# Patient Record
Sex: Female | Born: 1965 | Race: Black or African American | Hispanic: No | Marital: Single | Smoking: Never smoker
Health system: Southern US, Community
[De-identification: ages and names within clinical notes are randomized; demographics above are authoritative.]

## PROBLEM LIST (undated history)

## (undated) DIAGNOSIS — G510 Bell's palsy: Secondary | ICD-10-CM

## (undated) DIAGNOSIS — I10 Essential (primary) hypertension: Secondary | ICD-10-CM

## (undated) DIAGNOSIS — H409 Unspecified glaucoma: Secondary | ICD-10-CM

## (undated) DIAGNOSIS — A64 Unspecified sexually transmitted disease: Secondary | ICD-10-CM

## (undated) HISTORY — DX: Essential (primary) hypertension: I10

## (undated) HISTORY — DX: Unspecified sexually transmitted disease: A64

## (undated) HISTORY — PX: DILATION AND CURETTAGE OF UTERUS: SHX78

## (undated) HISTORY — DX: Bell's palsy: G51.0

## (undated) HISTORY — DX: Unspecified glaucoma: H40.9

---

## 1991-11-20 DIAGNOSIS — A64 Unspecified sexually transmitted disease: Secondary | ICD-10-CM

## 1991-11-20 HISTORY — DX: Unspecified sexually transmitted disease: A64

## 1998-10-10 ENCOUNTER — Other Ambulatory Visit: Admission: RE | Admit: 1998-10-10 | Discharge: 1998-10-10 | Payer: Self-pay | Admitting: Obstetrics and Gynecology

## 1999-10-27 ENCOUNTER — Other Ambulatory Visit: Admission: RE | Admit: 1999-10-27 | Discharge: 1999-10-27 | Payer: Self-pay | Admitting: Obstetrics and Gynecology

## 2000-08-19 ENCOUNTER — Emergency Department (HOSPITAL_COMMUNITY): Admission: EM | Admit: 2000-08-19 | Discharge: 2000-08-19 | Payer: Self-pay | Admitting: Emergency Medicine

## 2000-10-28 ENCOUNTER — Other Ambulatory Visit: Admission: RE | Admit: 2000-10-28 | Discharge: 2000-10-28 | Payer: Self-pay | Admitting: Obstetrics and Gynecology

## 2001-02-08 ENCOUNTER — Encounter (INDEPENDENT_AMBULATORY_CARE_PROVIDER_SITE_OTHER): Payer: Self-pay | Admitting: *Deleted

## 2001-02-08 ENCOUNTER — Ambulatory Visit (HOSPITAL_COMMUNITY): Admission: RE | Admit: 2001-02-08 | Discharge: 2001-02-08 | Payer: Self-pay | Admitting: Obstetrics and Gynecology

## 2001-11-03 ENCOUNTER — Other Ambulatory Visit: Admission: RE | Admit: 2001-11-03 | Discharge: 2001-11-03 | Payer: Self-pay | Admitting: Obstetrics and Gynecology

## 2001-11-19 DIAGNOSIS — G510 Bell's palsy: Secondary | ICD-10-CM

## 2001-11-19 HISTORY — DX: Bell's palsy: G51.0

## 2002-09-14 ENCOUNTER — Encounter: Payer: Self-pay | Admitting: Emergency Medicine

## 2002-09-14 ENCOUNTER — Encounter: Admission: RE | Admit: 2002-09-14 | Discharge: 2002-09-14 | Payer: Self-pay | Admitting: Emergency Medicine

## 2002-12-04 ENCOUNTER — Other Ambulatory Visit: Admission: RE | Admit: 2002-12-04 | Discharge: 2002-12-04 | Payer: Self-pay | Admitting: Obstetrics and Gynecology

## 2004-01-05 ENCOUNTER — Other Ambulatory Visit: Admission: RE | Admit: 2004-01-05 | Discharge: 2004-01-05 | Payer: Self-pay | Admitting: Obstetrics and Gynecology

## 2004-05-09 ENCOUNTER — Other Ambulatory Visit: Admission: RE | Admit: 2004-05-09 | Discharge: 2004-05-09 | Payer: Self-pay | Admitting: Obstetrics and Gynecology

## 2005-01-08 ENCOUNTER — Encounter: Admission: RE | Admit: 2005-01-08 | Discharge: 2005-01-08 | Payer: Self-pay | Admitting: Emergency Medicine

## 2005-12-19 ENCOUNTER — Other Ambulatory Visit: Admission: RE | Admit: 2005-12-19 | Discharge: 2005-12-19 | Payer: Self-pay | Admitting: Obstetrics and Gynecology

## 2006-01-14 ENCOUNTER — Ambulatory Visit (HOSPITAL_COMMUNITY): Admission: RE | Admit: 2006-01-14 | Discharge: 2006-01-14 | Payer: Self-pay | Admitting: Obstetrics and Gynecology

## 2006-01-14 ENCOUNTER — Encounter (INDEPENDENT_AMBULATORY_CARE_PROVIDER_SITE_OTHER): Payer: Self-pay | Admitting: Specialist

## 2006-01-14 HISTORY — PX: OTHER SURGICAL HISTORY: SHX169

## 2006-11-05 ENCOUNTER — Other Ambulatory Visit: Admission: RE | Admit: 2006-11-05 | Discharge: 2006-11-05 | Payer: Self-pay | Admitting: Obstetrics & Gynecology

## 2006-12-18 ENCOUNTER — Encounter: Admission: RE | Admit: 2006-12-18 | Discharge: 2006-12-18 | Payer: Self-pay | Admitting: Emergency Medicine

## 2007-01-13 ENCOUNTER — Other Ambulatory Visit: Admission: RE | Admit: 2007-01-13 | Discharge: 2007-01-13 | Payer: Self-pay | Admitting: Obstetrics and Gynecology

## 2007-12-22 ENCOUNTER — Encounter: Admission: RE | Admit: 2007-12-22 | Discharge: 2007-12-22 | Payer: Self-pay | Admitting: Obstetrics and Gynecology

## 2008-03-17 ENCOUNTER — Other Ambulatory Visit: Admission: RE | Admit: 2008-03-17 | Discharge: 2008-03-17 | Payer: Self-pay | Admitting: Obstetrics and Gynecology

## 2008-12-27 ENCOUNTER — Encounter: Admission: RE | Admit: 2008-12-27 | Discharge: 2008-12-27 | Payer: Self-pay | Admitting: Obstetrics and Gynecology

## 2009-03-18 ENCOUNTER — Other Ambulatory Visit: Admission: RE | Admit: 2009-03-18 | Discharge: 2009-03-18 | Payer: Self-pay | Admitting: Obstetrics and Gynecology

## 2010-01-02 ENCOUNTER — Encounter: Admission: RE | Admit: 2010-01-02 | Discharge: 2010-01-02 | Payer: Self-pay | Admitting: Obstetrics and Gynecology

## 2010-12-09 ENCOUNTER — Other Ambulatory Visit: Payer: Self-pay | Admitting: Obstetrics and Gynecology

## 2010-12-09 DIAGNOSIS — Z1239 Encounter for other screening for malignant neoplasm of breast: Secondary | ICD-10-CM

## 2011-01-08 ENCOUNTER — Ambulatory Visit: Payer: Self-pay

## 2011-01-22 ENCOUNTER — Ambulatory Visit
Admission: RE | Admit: 2011-01-22 | Discharge: 2011-01-22 | Disposition: A | Discharge status: Home / Self Care | Payer: Self-pay | Source: Ambulatory Visit | Attending: Obstetrics and Gynecology | Admitting: Obstetrics and Gynecology

## 2011-01-22 DIAGNOSIS — Z1239 Encounter for other screening for malignant neoplasm of breast: Secondary | ICD-10-CM

## 2011-04-06 NOTE — Op Note (Signed)
[facility]  Patient:    Carla Kaiser, Carla Kaiser                      MRN: [id number] Proc. Date: 02/08/01 Adm. Date:  [id number] Disc. Date: [id number] Attending:  Wandalee Ferdinand                           Operative Report  PREOPERATIVE DIAGNOSES:       1. Missed abortion at approximately eight weeks                                  gestation.                               2. Cervical polyp.  POSTOPERATIVE DIAGNOSES:      1. Missed abortion, pathology pending.                               2. Cervical polyp versus products of conception                                  presenting at cervical os - pathology                                  pending.  OPERATION:                    1. Suction dilatation and curettage.                               2. Polypectomy.  SURGEON:                      Rudy Jew. Ashley Royalty, M.D.  ANESTHESIA:                   Monitored anesthesia care and 1% Xylocaine                               paracervical block (10 cc).  ESTIMATED BLOOD LOSS:         50 cc.  COMPLICATIONS:                None.  PACKS AND DRAINS:             None.  DESCRIPTION OF PROCEDURE:     The patient was taken to the operating room and placed in the dorsal supine position.  After adequate IV sedation was administered, she was placed in the lithotomy position, and prepped and draped in usual manner for vaginal surgery.  A posterior weighted retractor was placed in the vagina.  The anterior lip of the cervix was grasped with a single tooth tenaculum.  The uterus was gently sounded to approximately 9 cm. The previously noted cervical polyp was visualized.  It was removed with an Allis clamp and submitted separately to pathology for histologic studies.  In the office, it looked clearly like a polyp.  However, in the operating room, the attached tissue  looked somewhat reminiscent of membranes.  Hence, it was felt to represent probably either a cervical  polyp or possibly products of conception.  At any rate, pathology is pending.  Next, the cervix was dilated to a size 25-French using Pratt dilators.  An 8 mm suction curet was introduced into the uterine cavity and suction applied. A moderate amount of apparent products of conception was delivered ________. After several passes, no additional tissue was noted.  At this point, a gentle sharp curettage was performed.  First, a four quadrant technique was used. Then, a therapeutic technique was used.  The suction curet was once again placed and suction applied.  There was no additional tissue obtained.  The vaginal instruments were removed.  Hemostasis was noted and the procedure terminated.  The patient was returned to the recovery room in excellent condition. _______ is pending. DD:  02/08/01 TD:  02/10/01 Job: [id number]

## 2011-04-06 NOTE — H&P (Signed)
[facility]  Patient:    Carla Kaiser, Carla Kaiser                      MRN: [id number] Adm. Date:  [id number] Attending:  Wandalee Ferdinand                         History and Physical  HISTORY OF PRESENT ILLNESS:   The patient is a 45 year old prima gravida at approximately [redacted] weeks gestation who was diagnosed within the last 48 hours having a missed abortion at time of ultrasound in the office. There is a singleton intrauterine pregnancy with no heart beat present. The patient has a known cervical versus endometrial polyp, as well, noted on recent pelvic examination. She was scheduled to have a suction D&C and polypectomy approximately February 11, 2001. However, she called this morning stating that she had developed a foul-smelling discharge and wanted to accelerate the procedure. She denies any fever or significant bleeding. However, the discharge has her worried. She, therefore, would like to accelerate the procedure today.  CURRENT MEDICATIONS:          None.  PAST MEDICAL HISTORY:         Negative.  PAST SURGICAL HISTORY:        Tonsillectomy.  ALLERGIES:                    AMOXICILLIN.  FAMILY HISTORY:               Noncontributory.  SOCIAL HISTORY:               The patient denies the use of tobacco or significant alcohol.  REVIEW OF SYSTEMS:            Noncontributory.  PHYSICAL EXAMINATION:  GENERAL:                      Well-developed, well-nourished, pleasant, black female in no acute distress.  VITAL SIGNS:                  Afebrile. Vital signs stable.  SKIN:                         Warm and dry without lesions.  LYMPH:                        There is no supraclavicular, cervical, or inguinal adenopathy.  HEENT:                        Normocephalic.  NECK:                         Supple without lymphadenopathy.  CHEST:                        Lungs are clear.  CARDIAC:                      Regular rate and rhythm without  murmurs, rubs, or gallops.  BREASTS:                      Deferred.  ABDOMEN:                      Soft and nontender  without masses or organomegaly. Bowel sounds are active.  MUSCULOSKELETAL:              Full range of motion without edema, cyanosis, or CVA tenderness.  PELVIC:                       Deferred until examination under anesthesia.  IMPRESSION:                   1. Missed abortion at approximately [redacted] weeks                                  gestation.                               2. Cervical versus endometrial polyp.  PLAN:                         1. Suction dilatation and curettage.                               2. Polypectomy.  Risks, benefits, complications, and alternatives fully discussed with the patient. She states she understands and accepts. Questions were invited and answered. DD:  02/08/01 TD:  02/08/01 Job: [id number]

## 2011-04-06 NOTE — Op Note (Signed)
Carla Kaiser, Carla Kaiser             ACCOUNT NO.:  [id number]   MEDICAL RECORD NO.:  [id number]          PATIENT TYPE:  AMB   LOCATION:  SDC                           FACILITY:  [facility]   PHYSICIAN:  Cynthia P. Romine, M.D.DATE OF BIRTH:  03-23-1966   DATE OF PROCEDURE:  01/14/2006  DATE OF DISCHARGE:                                 OPERATIVE REPORT   PREOPERATIVE DIAGNOSIS:  Menorrhagia and endometrial polyp.   POSTOPERATIVE DIAGNOSIS:  Menorrhagia and endometrial polyp, pathology  pending.   OPERATION/PROCEDURE:  Hysteroscopic resection, dilatation and curettage.   SURGEON:  Cynthia P. Romine, M.D.   ANESTHESIA:  General LMA.   ESTIMATED BLOOD LOSS:  Minimal.   FLUIDS DEFICIT:  Sorbitol deficit minimal.   COMPLICATIONS:  None.   DESCRIPTION OF PROCEDURE:  The patient was taken to the operating room and  after induction of adequate general anesthesia by LMA was placed in the  dorsal lithotomy position, prepped and draped in the usual fashion.  Bladder  was drained with the red rubber catheter.  A posterior weighted and anterior  Sims retractor was placed.  The cervix was grasped on the anterior lip with  the single-tooth tenaculum.  Uterus sounded to 8 cm.  The cervix was dilated  to #31 Shawnie Pons.  The operative hysteroscope was inserted.  The polyp in the  lower uterine segment was obstructing the view of the cavity as the scope  went in.  Therefore, the scope was withdrawn.  Polyp forceps produced and a  segment of the polyp was removed with the polyp forceps.  The scope was then  reintroduced and adequate visualization was could be seen.  The segment of  the polyp that was still attached to the uterus was seen on the anterior  right lower uterine segment.  This was dissected with a single loop.  The  rest of the cavity appeared clean.  Photographic documentation was taken.  The hysteroscope was removed.  Sharp curettage was gently carried out.  Specimens sent to pathology.  The  scope was reintroduced.  Visualization  showed that the cavity was clean.  The endocervix was clean.  Photographic  documentation was taken.  The scope was removed and the procedure was  terminated.  The instruments were removed from the vagina and the patient  was taken to the recovery room in satisfactory condition.           ______________________________  Edwena Felty. Romine, M.D.     CPR/MEDQ  D:  01/14/2006  T:  01/15/2006  Job:  [id number]

## 2011-12-06 ENCOUNTER — Other Ambulatory Visit: Payer: Self-pay | Admitting: Obstetrics and Gynecology

## 2011-12-06 DIAGNOSIS — Z1231 Encounter for screening mammogram for malignant neoplasm of breast: Secondary | ICD-10-CM

## 2012-01-28 ENCOUNTER — Ambulatory Visit: Payer: BC Managed Care – PPO

## 2012-02-01 ENCOUNTER — Ambulatory Visit: Payer: BC Managed Care – PPO

## 2012-02-04 ENCOUNTER — Ambulatory Visit
Admission: RE | Admit: 2012-02-04 | Discharge: 2012-02-04 | Disposition: A | Discharge status: Home / Self Care | Payer: BC Managed Care – PPO | Source: Ambulatory Visit | Attending: Obstetrics and Gynecology | Admitting: Obstetrics and Gynecology

## 2012-02-04 DIAGNOSIS — Z1231 Encounter for screening mammogram for malignant neoplasm of breast: Secondary | ICD-10-CM

## 2012-02-04 LAB — HM MAMMOGRAPHY: HM Mammogram: NORMAL

## 2013-01-15 ENCOUNTER — Other Ambulatory Visit: Payer: Self-pay

## 2013-01-15 DIAGNOSIS — Z1231 Encounter for screening mammogram for malignant neoplasm of breast: Secondary | ICD-10-CM

## 2013-02-09 ENCOUNTER — Ambulatory Visit: Payer: BC Managed Care – PPO

## 2013-02-23 ENCOUNTER — Ambulatory Visit
Admission: RE | Admit: 2013-02-23 | Discharge: 2013-02-23 | Disposition: A | Discharge status: Home / Self Care | Payer: BC Managed Care – PPO | Source: Ambulatory Visit

## 2013-02-23 DIAGNOSIS — Z1231 Encounter for screening mammogram for malignant neoplasm of breast: Secondary | ICD-10-CM

## 2013-04-28 ENCOUNTER — Encounter: Payer: Self-pay | Admitting: *Deleted

## 2013-05-11 ENCOUNTER — Ambulatory Visit: Payer: Self-pay | Admitting: Nurse Practitioner

## 2013-06-02 ENCOUNTER — Ambulatory Visit: Payer: Self-pay | Admitting: Nurse Practitioner

## 2013-06-18 ENCOUNTER — Ambulatory Visit (INDEPENDENT_AMBULATORY_CARE_PROVIDER_SITE_OTHER): Payer: BC Managed Care – PPO | Admitting: Nurse Practitioner

## 2013-06-18 ENCOUNTER — Encounter: Payer: Self-pay | Admitting: Nurse Practitioner

## 2013-06-18 VITALS — BP 124/60 | HR 72 | Resp 14 | Ht 61.25 in | Wt 172.8 lb

## 2013-06-18 DIAGNOSIS — Z01419 Encounter for gynecological examination (general) (routine) without abnormal findings: Secondary | ICD-10-CM

## 2013-06-18 DIAGNOSIS — Z0000 Encounter for general adult medical examination without abnormal findings: Secondary | ICD-10-CM

## 2013-06-18 LAB — POCT URINALYSIS DIPSTICK
Spec Grav, UA: 1.015
Urobilinogen, UA: NEGATIVE

## 2013-06-18 NOTE — Progress Notes (Signed)
47 y.o. G1P0010 Single African American Fe here for annual exam.  Same partner X 20 years. Menses normal lasting 4-5 days. No birth control.   Patient's last menstrual period was 06/03/2013.          Sexually active: yes  The current method of family planning is none.    Exercising: yes  walking  Smoker:  no  Health Maintenance: Pap:  04/29/2012  Normal with negative HR HPV MMG:  03/06/2013 normal Colonoscopy:  2009 normal repeat 10 years BMD:   never TDaP:  04/29/2012 Labs: Hgb- 13.5    reports that she has never smoked. She has never used smokeless tobacco. She reports that she drinks about 1.0 ounces of alcohol per week. She reports that she does not use illicit drugs.  Past Medical History  Diagnosis Date  . STD (sexually transmitted disease)     hx of syphillis   . Bell's palsy     Past Surgical History  Procedure Laterality Date  . Hysteroscopic resection    . Dilation and curettage of uterus      Current Outpatient Prescriptions  Medication Sig Dispense Refill  . cefdinir (OMNICEF) 300 MG capsule Take 300 mg by mouth.      . CHERATUSSIN AC 100-10 MG/5ML syrup       . Multiple Vitamin (MULTIVITAMIN) tablet Take 1 tablet by mouth daily.      . predniSONE (DELTASONE) 20 MG tablet Take 20 mg by mouth.       No current facility-administered medications for this visit.    History reviewed. No pertinent family history.  ROS:  Pertinent items are noted in HPI.  Otherwise, a comprehensive ROS was negative.  Exam:   BP 124/60  Pulse 72  Resp 14  Ht 5' 1.25" (1.556 m)  Wt 172 lb 12.8 oz (78.382 kg)  BMI 32.37 kg/m2  LMP 06/03/2013 Height: 5' 1.25" (155.6 cm)  Ht Readings from Last 3 Encounters:  06/18/13 5' 1.25" (1.556 m)    General appearance: alert, cooperative and appears stated age Head: Normocephalic, without obvious abnormality, atraumatic Neck: no adenopathy, supple, symmetrical, trachea midline and thyroid normal to inspection and palpation Lungs: clear  to auscultation bilaterally Breasts: normal appearance, no masses or tenderness Heart: regular rate and rhythm Abdomen: soft, non-tender; no masses,  no organomegaly Extremities: extremities normal, atraumatic, no cyanosis or edema Skin: Skin color, texture, turgor normal. No rashes or lesions Lymph nodes: Cervical, supraclavicular, and axillary nodes normal. No abnormal inguinal nodes palpated Neurologic: Grossly normal   Pelvic: External genitalia:  no lesions              Urethra:  normal appearing urethra with no masses, tenderness or lesions              Bartholin's and Skene's: normal                 Vagina: normal appearing vagina with normal color and discharge, no lesions              Cervix: anteverted              Pap taken: no Bimanual Exam:  Uterus:  normal size, contour, position, consistency, mobility, non-tender              Adnexa: no mass, fullness, tenderness               Rectovaginal: Confirms               Anus:  normal sphincter tone, no lesions  A:  Well Woman with normal exam  No method of birth control since 2001 (suspected infertility)  P:   Pap smear as per guidelines   Mammogram due 02/2014  counseled on breast self exam, adequate intake of calcium and vitamin D, diet and exercise return annually or prn  An After Visit Summary was printed and given to the patient.

## 2013-06-18 NOTE — Patient Instructions (Addendum)

## 2013-06-23 NOTE — Progress Notes (Signed)
Encounter reviewed by Dr. Kalvyn Desa Silva.  

## 2013-11-30 ENCOUNTER — Other Ambulatory Visit: Payer: Self-pay

## 2013-11-30 DIAGNOSIS — Z1231 Encounter for screening mammogram for malignant neoplasm of breast: Secondary | ICD-10-CM

## 2014-01-15 ENCOUNTER — Telehealth: Payer: Self-pay | Admitting: Nurse Practitioner

## 2014-01-15 NOTE — Telephone Encounter (Signed)
Period started on February 11th and she is still having some spotting.

## 2014-01-15 NOTE — Telephone Encounter (Signed)
Spoke with patient. She is having ongoing spotting. Wearing a panty liner only, noticing small amounts of blood on liner. She usually has period q 26 days and has been very regular. Denies pain. Offered office visit with Lauro FranklinPatricia Rolen-Grubb, FNP to discuss ongoing spotting and patient is agreeable. Office visit for 01/18/14 scheduled. Patient agreeable.   Routing to provider for final review. Patient agreeable to disposition. Will close encounter

## 2014-01-18 ENCOUNTER — Ambulatory Visit: Payer: BC Managed Care – PPO | Admitting: Nurse Practitioner

## 2014-01-18 ENCOUNTER — Telehealth: Payer: Self-pay | Admitting: Nurse Practitioner

## 2014-01-18 NOTE — Telephone Encounter (Signed)
Patient called an canceled her appt for today for the ongoing spotting. Said she wants to give it another month. See previous message below  Almedia Ballsracy Fast, RN at 01/15/2014 11:27 AM    Status: Signed       Spoke with patient. She is having ongoing spotting. Wearing a panty liner only, noticing small amounts of blood on liner. She usually has period q 26 days and has been very regular. Denies pain. Offered office visit with Lauro FranklinPatricia Rolen-Grubb, FNP to discuss ongoing spotting and patient is agreeable. Office visit for 01/18/14 scheduled. Patient agreeable.  Routing to provider for final review. Patient agreeable to disposition. Will close encounter        Charlyn MinervaGreta L Williams at 01/15/2014 10:12 AM     Status: Signed        Period started on February 11th and she is still having some spotting.

## 2014-03-01 ENCOUNTER — Ambulatory Visit
Admission: RE | Admit: 2014-03-01 | Discharge: 2014-03-01 | Disposition: A | Discharge status: Home / Self Care | Payer: BC Managed Care – PPO | Source: Ambulatory Visit

## 2014-03-01 DIAGNOSIS — Z1231 Encounter for screening mammogram for malignant neoplasm of breast: Secondary | ICD-10-CM

## 2014-03-17 ENCOUNTER — Telehealth: Payer: Self-pay | Admitting: Nurse Practitioner

## 2014-03-17 NOTE — Telephone Encounter (Signed)
Patient has some questions regarding her mammogram results

## 2014-03-17 NOTE — Telephone Encounter (Signed)
Spoke with patient. She received a letter explaining that she has dense breast tissue and has questions. Discussed breast density category ratings and her density per Mammogram is :ACR Breast Density Category c: The breast tissue is heterogeneously dense, which may obscure small masses.   Advised patient that mammogram is still normal. May want to consider 3D mammograms going forward and continue with monthly breast self exams and notify us if she notices any issues. Patient verbalized understanding. Will follow up prn.  Routing to provider for final review. Patient agreeable to disposition. Will close encounter

## 2014-07-14 ENCOUNTER — Telehealth: Payer: Self-pay | Admitting: Nurse Practitioner

## 2014-07-14 NOTE — Telephone Encounter (Signed)
Left message to call Kaitlyn at [phone]. 

## 2014-07-14 NOTE — Telephone Encounter (Signed)
Pt wants to speak with the nurse about getting a colonoscopy done.

## 2014-07-16 NOTE — Telephone Encounter (Signed)
Left message to call Kaitlyn at [phone]. 

## 2014-07-16 NOTE — Telephone Encounter (Signed)
Spoke with patient. Advised do no have record of where colonoscopy was preformed. Advised it will be best for patient to call PCP where they have the records to see where this was done. Advised to call that location to ask about when follow up is needed for her last colonoscopy. Patient is agreeable and verbalizes understanding.  Routing to provider for final review. Patient agreeable to disposition. Will close encounter

## 2014-07-16 NOTE — Telephone Encounter (Signed)
Spoke with patient. Patient states that when she went to see her PCP she was told that she had a colonoscopy in 2007 and was due for follow up in 2009 due to a polyp that was found. Patient states that she is not having any current problems and would like to wait the ten years before having another one. "I am not having any current problems. So I was wondering if I could wait." Advised patient we have on file her last was in 2009. Patient states she thinks it was a mix up and she last had one in 2007. Patient can not remember where she had this done. Advised would pull paper chart to see if we have any record of it and call patient back. Advised it will be best for her to contact who performed colonoscopy to ask what they recommend for patient. She is agreeable.

## 2014-07-16 NOTE — Telephone Encounter (Signed)
Left message to call Kaitlyn at [phone].  Advise patient that I pulled paper chart and I do not see in file from colonoscopy. Advised will need to contact PCP who has the record to get phone number so she can call and speak with them about when she should have colonoscopy repeated.

## 2014-07-29 ENCOUNTER — Ambulatory Visit: Payer: BC Managed Care – PPO | Admitting: Nurse Practitioner

## 2014-09-20 ENCOUNTER — Encounter: Payer: Self-pay | Admitting: Nurse Practitioner

## 2015-02-02 ENCOUNTER — Other Ambulatory Visit: Payer: Self-pay

## 2015-02-02 DIAGNOSIS — Z1231 Encounter for screening mammogram for malignant neoplasm of breast: Secondary | ICD-10-CM

## 2015-03-07 ENCOUNTER — Ambulatory Visit
Admission: RE | Admit: 2015-03-07 | Discharge: 2015-03-07 | Disposition: A | Discharge status: Home / Self Care | Payer: BLUE CROSS/BLUE SHIELD | Source: Ambulatory Visit

## 2015-03-07 ENCOUNTER — Ambulatory Visit: Payer: Self-pay

## 2015-03-07 DIAGNOSIS — Z1231 Encounter for screening mammogram for malignant neoplasm of breast: Secondary | ICD-10-CM

## 2015-03-14 ENCOUNTER — Ambulatory Visit: Payer: Self-pay

## 2015-08-10 ENCOUNTER — Ambulatory Visit (INDEPENDENT_AMBULATORY_CARE_PROVIDER_SITE_OTHER): Payer: BLUE CROSS/BLUE SHIELD | Admitting: Nurse Practitioner

## 2015-08-10 ENCOUNTER — Encounter: Payer: Self-pay | Admitting: Nurse Practitioner

## 2015-08-10 VITALS — BP 126/80 | HR 76 | Ht 61.75 in | Wt 179.0 lb

## 2015-08-10 DIAGNOSIS — E559 Vitamin D deficiency, unspecified: Secondary | ICD-10-CM | POA: Diagnosis not present

## 2015-08-10 DIAGNOSIS — Z01419 Encounter for gynecological examination (general) (routine) without abnormal findings: Secondary | ICD-10-CM | POA: Diagnosis not present

## 2015-08-10 DIAGNOSIS — Z0000 Encounter for general adult medical examination without abnormal findings: Secondary | ICD-10-CM

## 2015-08-10 LAB — POCT URINALYSIS DIPSTICK
BILIRUBIN UA: NEGATIVE
Blood, UA: NEGATIVE
GLUCOSE UA: NEGATIVE
KETONES UA: NEGATIVE
LEUKOCYTES UA: NEGATIVE
Nitrite, UA: NEGATIVE
PH UA: 5
Protein, UA: NEGATIVE
Urobilinogen, UA: NEGATIVE

## 2015-08-10 LAB — HEMOGLOBIN, FINGERSTICK: HEMOGLOBIN, FINGERSTICK: 12.4 g/dL (ref 12.0–16.0)

## 2015-08-10 NOTE — Progress Notes (Signed)
Patient ID: Carla Kaiser, female   DOB: May 11, 1966, 49 y.o.   MRN: [id number] 49 y.o. G1P0010 Single  African American Fe here for annual exam.  Menses now at for 6-7 days and heavy X 2. same partner 25 years.  Patient's last menstrual period was 08/06/2015 (exact date).          Sexually active: Yes.    The current method of family planning is none.    Exercising: Yes.    walking 1 mile everyday Smoker:  no  Health Maintenance: Pap: 04/29/2012 Normal with negative HR HPV MMG: 03/07/15, Bi-Rads 1:  Negative  Colonoscopy: 2009 polyps with bleeding repeat 10 years  TDaP: 04/29/2012 Labs: HB:  12.4  Urine:  Negative    reports that she has never smoked. She has never used smokeless tobacco. She reports that she drinks about 1.0 oz of alcohol per week. She reports that she does not use illicit drugs.  Past Medical History  Diagnosis Date  . Bell's palsy 2003    left cheek  . STD (sexually transmitted disease) 1993    hx of syphillis  treated    Past Surgical History  Procedure Laterality Date  . Hysteroscopic resection  01/14/06    Removal of polyp and D&C  . Dilation and curettage of uterus      Current Outpatient Prescriptions  Medication Sig Dispense Refill  . Omega-3 Fatty Acids (FISH OIL) 1200 MG CAPS Take 2 capsules by mouth daily.     No current facility-administered medications for this visit.    Family History  Problem Relation Age of Onset  . Hypertension Mother   . Hypertension Father     ROS:  Pertinent items are noted in HPI.  Otherwise, a comprehensive ROS was negative.  Exam:   BP 126/80 mmHg  Pulse 76  Ht 5' 1.75" (1.568 m)  Wt 179 lb (81.194 kg)  BMI 33.02 kg/m2  LMP 08/06/2015 (Exact Date) Height: 5' 1.75" (156.8 cm) Ht Readings from Last 3 Encounters:  08/10/15 5' 1.75" (1.568 m)  06/18/13 5' 1.25" (1.556 m)    General appearance: alert, cooperative and appears stated age Head: Normocephalic, without obvious abnormality, atraumatic Neck:  no adenopathy, supple, symmetrical, trachea midline and thyroid normal to inspection and palpation Lungs: clear to auscultation bilaterally Breasts: normal appearance, no masses or tenderness Heart: regular rate and rhythm Abdomen: soft, non-tender; no masses,  no organomegaly Extremities: extremities normal, atraumatic, no cyanosis or edema Skin: Skin color, texture, turgor normal. No rashes or lesions Lymph nodes: Cervical, supraclavicular, and axillary nodes normal. No abnormal inguinal nodes palpated Neurologic: Grossly normal   Pelvic: External genitalia:  no lesions              Urethra:  normal appearing urethra with no masses, tenderness or lesions              Bartholin's and Skene's: normal                 Vagina: normal appearing vagina with normal color and discharge, no lesions              Cervix: anteverted              Pap taken: Yes.   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

## 2015-08-10 NOTE — Patient Instructions (Signed)

## 2015-08-11 ENCOUNTER — Other Ambulatory Visit: Payer: Self-pay | Admitting: Certified Nurse Midwife

## 2015-08-11 DIAGNOSIS — R899 Unspecified abnormal finding in specimens from other organs, systems and tissues: Secondary | ICD-10-CM

## 2015-08-11 LAB — LIPID PANEL
CHOLESTEROL: 221 mg/dL — AB (ref 125–200)
HDL: 54 mg/dL (ref >=46)
LDL Cholesterol: 143 mg/dL — ABNORMAL HIGH (ref <130)
Total CHOL/HDL Ratio: 4.1 Ratio (ref <=5.0)
Triglycerides: 119 mg/dL (ref <150)
VLDL: 24 mg/dL (ref <30)

## 2015-08-11 LAB — COMPREHENSIVE METABOLIC PANEL
ALT: 18 U/L (ref 6–29)
AST: 17 U/L (ref 10–35)
Albumin: 3.9 g/dL (ref 3.6–5.1)
Alkaline Phosphatase: 62 U/L (ref 33–115)
BUN: 13 mg/dL (ref 7–25)
CHLORIDE: 107 mmol/L (ref 98–110)
CO2: 25 mmol/L (ref 20–31)
CREATININE: 0.89 mg/dL (ref 0.50–1.10)
Calcium: 8.9 mg/dL (ref 8.6–10.2)
GLUCOSE: 89 mg/dL (ref 65–99)
Potassium: 4.5 mmol/L (ref 3.5–5.3)
SODIUM: 142 mmol/L (ref 135–146)
TOTAL PROTEIN: 6.6 g/dL (ref 6.1–8.1)
Total Bilirubin: 0.5 mg/dL (ref 0.2–1.2)

## 2015-08-11 LAB — VITAMIN D 25 HYDROXY (VIT D DEFICIENCY, FRACTURES): VIT D 25 HYDROXY: 17 ng/mL — AB (ref 30–100)

## 2015-08-11 LAB — TSH: TSH: 0.482 u[IU]/mL (ref 0.350–4.500)

## 2015-08-11 MED ORDER — VITAMIN D (ERGOCALCIFEROL) 1.25 MG (50000 UNIT) PO CAPS: 50000.0000 [IU] | ORAL_CAPSULE | ORAL | Status: DC

## 2015-08-12 LAB — IPS PAP TEST WITH HPV

## 2015-08-14 NOTE — Progress Notes (Signed)
Encounter reviewed by Dr. Brook Amundson C. Silva.  

## 2015-09-22 ENCOUNTER — Telehealth: Payer: Self-pay | Admitting: Nurse Practitioner

## 2015-09-22 NOTE — Telephone Encounter (Signed)
Call to patient, voice mail message says patient has traveled outside service area, unable to leave message. Patient may speak to [place] RockSally or [place]aitlyn.

## 2015-09-22 NOTE — Telephone Encounter (Signed)
Patient called and said, "I'd like my recent HPV results please? I got all the other results but not that one yet."

## 2015-09-22 NOTE — Telephone Encounter (Signed)
Return call to patient, left message to call back, ask for triage.

## 2015-09-22 NOTE — Telephone Encounter (Signed)
Patient returning your call [phone].

## 2015-09-23 NOTE — Telephone Encounter (Signed)
Spoke with patient. Advised her pap smear was negative with negative HPV. Advised findings of trichomonas on her pap. Please see results from 08/10/2015. Advised I have reviewed this with Dr.Miller who recommends that she return for affirm testing as we do not like to diagnose trichomonas off of a pap smear. Patient is agreeable and verbalizes understanding. Patient is requesting to be seen on 11/9 as this is her day off. Appointment scheduled for 11/9 at 10:15 am with Ria CommentPatricia Grubb, FNP. Patient is agreeable to date and time.  Routing to provider for final review. Patient agreeable to disposition. Will close encounter.

## 2015-09-23 NOTE — Telephone Encounter (Signed)
Recommend appt for confirmation and for discussion of additional STD testing.  Encounter closed.

## 2015-09-23 NOTE — Telephone Encounter (Signed)
Sorry I missed seeing this Trich on her pap.  But why treatment from trich on pap not good - but we do treat for BV and yeast??

## 2015-09-28 ENCOUNTER — Encounter: Payer: Self-pay | Admitting: Nurse Practitioner

## 2015-09-28 ENCOUNTER — Ambulatory Visit (INDEPENDENT_AMBULATORY_CARE_PROVIDER_SITE_OTHER): Payer: BLUE CROSS/BLUE SHIELD | Admitting: Nurse Practitioner

## 2015-09-28 VITALS — BP 130/84 | HR 88 | Temp 98.4°F | Resp 16 | Ht 61.75 in | Wt 183.0 lb

## 2015-09-28 DIAGNOSIS — N760 Acute vaginitis: Secondary | ICD-10-CM

## 2015-09-28 NOTE — Patient Instructions (Addendum)
Will call with test results

## 2015-09-28 NOTE — Progress Notes (Signed)
49 y.o. Single African American female G1P0010 here for a follow up of trichomonas found on her pap smear.  She had a "terrible yeast" infection prior to her AEX and treated herself with OTC med's.  She currently has no symptoms of vaginitis or itching.  We have discussed the nature of this finding and that it may be a false positive.  However we are going to retest her for this along with all STD's.  She is in agreement.  She has been with this partner for 25 years.  Contraception is none since 2001 - suspects infertility.   O:  Healthy female WDWN Affect: normal, orientation x 3  Exam: no distress Abdomen:  Soft and non tender Lymph node: no enlargement or tenderness Pelvic exam: External genital: normal female BUS: negative Vagina: minimal norma discharge noted.  Affirm taken. Cervix: normal, non tender, no CMT Uterus: normal, non tender Adnexa:normal, non tender, no masses or fullness noted    A: Vaginitis - Trichomonas on pap  Same partner for 25 years  R/O other STD's  History of + syphilis in 1993 and treated   P: Discussed findings of vaginitis and etiology. Discussed Aveeno or baking soda sitz bath for comfort. Avoid moist clothes or pads for extended period of time. If working out in gym clothes or swim suits for long periods of time change underwear or bottoms of swimsuit if possible. Olive Oil/Coconut Oil use for skin protection prior to activity can be used to external skin.  Rx: none pending test results  Follow with Affirm  RV prn

## 2015-09-29 ENCOUNTER — Other Ambulatory Visit: Payer: Self-pay | Admitting: Certified Nurse Midwife

## 2015-09-29 ENCOUNTER — Telehealth: Payer: Self-pay

## 2015-09-29 DIAGNOSIS — A5901 Trichomonal vulvovaginitis: Secondary | ICD-10-CM

## 2015-09-29 LAB — WET PREP BY MOLECULAR PROBE
Candida species: NEGATIVE
Gardnerella vaginalis: NEGATIVE
Trichomonas vaginosis: POSITIVE — AB

## 2015-09-29 LAB — STD PANEL
HIV: NONREACTIVE
Hepatitis B Surface Ag: NEGATIVE

## 2015-09-29 MED ORDER — METRONIDAZOLE 500 MG PO TABS: ORAL_TABLET | ORAL | Status: DC

## 2015-09-29 NOTE — Telephone Encounter (Signed)
-----   Message from Verner Choleborah S Leonard, CNM sent at 09/29/2015 12:24 PM EST ----- Notify patient that her affirm testing was positive for trichomonas, yeast and bacteria were negative. Trichomonas is considered an STD and partner needs treatment. Order in for Flagyl 2 gm single dose  Will need  TOC in one month. Please schedule Hep. B, HIV,RPR are negative, GC,Chlamydia pending

## 2015-09-29 NOTE — Telephone Encounter (Signed)
Spoke with patient. Advised patient of message as seen below from PepsiCoDeborah Leonard CNM. Patient is agreeable and verbalizes understanding. [place] and will pick up prescription at that time. Offered to transfer rx to a pharmacy close to her location but patient declines. Patient would like to return call next week to schedule a 1 month recheck so that she can check her work calendar. Will keep in result box to ensure she is scheduled. Aware Gc/Chl are pending.  Routing to provider for final review. Patient agreeable to disposition. Will close encounter.

## 2015-09-29 NOTE — Telephone Encounter (Signed)
Patient is returning a call to [place]. [place] and ask if a detailed message can be left on her voicemail.

## 2015-09-29 NOTE — Telephone Encounter (Signed)
Left message to call Kaitlyn at [phone]. 

## 2015-09-30 LAB — IPS N GONORRHOEA AND CHLAMYDIA BY PCR

## 2015-10-02 NOTE — Progress Notes (Signed)
Encounter reviewed by Dr. Brook Amundson C. Silva.  

## 2015-11-10 ENCOUNTER — Other Ambulatory Visit: Payer: BLUE CROSS/BLUE SHIELD

## 2015-11-11 ENCOUNTER — Ambulatory Visit: Payer: Self-pay | Admitting: Nurse Practitioner

## 2015-11-11 ENCOUNTER — Encounter: Payer: Self-pay | Admitting: Nurse Practitioner

## 2015-11-11 ENCOUNTER — Telehealth: Payer: Self-pay | Admitting: Nurse Practitioner

## 2015-11-11 NOTE — Telephone Encounter (Signed)
Patient just called to cancel her appointment today. Patient was on her way and got sick in the car, patient is turning around to go home. Patient says she has not been feeling well. Patient will call next week to reschedule.

## 2015-11-15 ENCOUNTER — Encounter: Payer: Self-pay | Admitting: Nurse Practitioner

## 2015-12-02 ENCOUNTER — Encounter: Payer: Self-pay | Admitting: Nurse Practitioner

## 2015-12-02 ENCOUNTER — Ambulatory Visit (INDEPENDENT_AMBULATORY_CARE_PROVIDER_SITE_OTHER): Payer: BLUE CROSS/BLUE SHIELD | Admitting: Nurse Practitioner

## 2015-12-02 VITALS — BP 124/82 | HR 64 | Ht 61.75 in | Wt 184.0 lb

## 2015-12-02 DIAGNOSIS — N760 Acute vaginitis: Secondary | ICD-10-CM

## 2015-12-02 DIAGNOSIS — E559 Vitamin D deficiency, unspecified: Secondary | ICD-10-CM

## 2015-12-02 NOTE — Progress Notes (Signed)
50 y.o. Single African American female G1P0010 here for follow up of trichomonas vaginitis that ws diagnosed on 09/28/15.  She was treated with Flagyl initiated on 10/01/15.  Partner also was treated.  Completed all medication as directed.  Denies any symptoms of vaginitis other than slight discomfort after menses.  No other urinary or pelvic symptoms.    O:  Healthy WD,WN female  Affect: normal  Abdomen: soft and non tender  Pelvic exam:EXTERNAL GENITALIA: normal appearing vulva with no masses,  tenderness or lesions. Normal vaginal discharge. Specimen is taken  A: TOC for Trichomonas vaginitis   History of Vit D deficiency    P:  Will follow with test results and recommendations about Vit d   Labs:  Vit D and Affirm    RV

## 2015-12-03 LAB — WET PREP BY MOLECULAR PROBE
CANDIDA SPECIES: POSITIVE — AB
Gardnerella vaginalis: NEGATIVE
TRICHOMONAS VAG: NEGATIVE

## 2015-12-03 LAB — VITAMIN D 25 HYDROXY (VIT D DEFICIENCY, FRACTURES): VIT D 25 HYDROXY: 39 ng/mL (ref 30–100)

## 2015-12-03 NOTE — Progress Notes (Signed)
Encounter reviewed by Dr. Brook Amundson C. Silva.  

## 2015-12-05 ENCOUNTER — Other Ambulatory Visit: Payer: Self-pay | Admitting: Nurse Practitioner

## 2015-12-05 MED ORDER — FLUCONAZOLE 150 MG PO TABS: 150.0000 mg | ORAL_TABLET | Freq: Once | ORAL | Status: DC

## 2016-02-01 ENCOUNTER — Other Ambulatory Visit: Payer: Self-pay

## 2016-02-01 DIAGNOSIS — Z1231 Encounter for screening mammogram for malignant neoplasm of breast: Secondary | ICD-10-CM

## 2016-02-20 ENCOUNTER — Telehealth: Payer: Self-pay | Admitting: Nurse Practitioner

## 2016-02-20 NOTE — Telephone Encounter (Signed)
Patient is asking to talk with Patty Grubb's nurse regarding her irregular cycles. Last seen 12/02/15.

## 2016-02-20 NOTE — Telephone Encounter (Signed)
Spoke with patient. Patient states that her LMP started on 11/20/2015. States that this cycle lasted for 7-10 days, but was not heavy. Reports she has not had a cycle since January. Denies any PMS symptoms monthly. "I know I am still ovulating." Reports she is tracking her ovulation and has "slimy" discharge right before and during the time of the month she is supposed to have her cycle. Does not feel there is any chance for pregnancy. She is not on any form of birth control. Advised she will need to be seen in the office for further evaluation. She is agreeable. Appointment scheduled for 02/29/2016 at 4 pm with Ria CommentPatricia Grubb, FNP. She declines an earlier appointment due to work.  Routing to provider for final review. Patient agreeable to disposition. Will close encounter.

## 2016-02-27 ENCOUNTER — Telehealth: Payer: Self-pay | Admitting: Nurse Practitioner

## 2016-02-27 NOTE — Telephone Encounter (Signed)
Patient is scheduled for appointment for irregular periods on Wed but her cycle came on the 5th. Should she still keep appointment?

## 2016-02-27 NOTE — Telephone Encounter (Signed)
Spoke with patient. She is scheduled for an OV on 02/29/2016 with Ria CommentPatricia Grubb, FNP for evaluation as her LMP was 11/20/2015. Reports since scheduling the appointment she started her cycle on 02/22/2016. States that her bleeding is "like a normal cycle." Denies any heavy bleeding. Advised it is okay to cancel appointment on 02/29/2016. Will need to monitor her bleeding and reschedule appointment if her bleeding if heavy or prolonged. She is agreeable. Advised she will need to monitor her cycles and if she misses 3 cycles in a row she will need to be seen in the office for further evaluation. She is agreeable.  Routing to provider for final review. Patient agreeable to disposition. Will close encounter.

## 2016-02-29 ENCOUNTER — Ambulatory Visit: Payer: BLUE CROSS/BLUE SHIELD | Admitting: Nurse Practitioner

## 2016-03-12 ENCOUNTER — Ambulatory Visit
Admission: RE | Admit: 2016-03-12 | Discharge: 2016-03-12 | Disposition: A | Discharge status: Home / Self Care | Payer: BLUE CROSS/BLUE SHIELD | Source: Ambulatory Visit

## 2016-03-12 DIAGNOSIS — Z1231 Encounter for screening mammogram for malignant neoplasm of breast: Secondary | ICD-10-CM | POA: Diagnosis not present

## 2016-07-18 DIAGNOSIS — M533 Sacrococcygeal disorders, not elsewhere classified: Secondary | ICD-10-CM | POA: Diagnosis not present

## 2016-08-20 ENCOUNTER — Ambulatory Visit: Payer: BLUE CROSS/BLUE SHIELD | Admitting: Nurse Practitioner

## 2016-08-20 ENCOUNTER — Telehealth: Payer: Self-pay | Admitting: Nurse Practitioner

## 2016-08-20 ENCOUNTER — Encounter: Payer: Self-pay | Admitting: Obstetrics and Gynecology

## 2016-08-20 ENCOUNTER — Ambulatory Visit (INDEPENDENT_AMBULATORY_CARE_PROVIDER_SITE_OTHER): Payer: BLUE CROSS/BLUE SHIELD | Admitting: Obstetrics and Gynecology

## 2016-08-20 VITALS — BP 160/90 | HR 72 | Resp 15 | Ht 61.75 in | Wt 172.0 lb

## 2016-08-20 DIAGNOSIS — N951 Menopausal and female climacteric states: Secondary | ICD-10-CM

## 2016-08-20 DIAGNOSIS — Z01419 Encounter for gynecological examination (general) (routine) without abnormal findings: Secondary | ICD-10-CM | POA: Diagnosis not present

## 2016-08-20 DIAGNOSIS — R030 Elevated blood-pressure reading, without diagnosis of hypertension: Secondary | ICD-10-CM

## 2016-08-20 DIAGNOSIS — Z0000 Encounter for general adult medical examination without abnormal findings: Secondary | ICD-10-CM

## 2016-08-20 DIAGNOSIS — E559 Vitamin D deficiency, unspecified: Secondary | ICD-10-CM | POA: Diagnosis not present

## 2016-08-20 DIAGNOSIS — N926 Irregular menstruation, unspecified: Secondary | ICD-10-CM | POA: Diagnosis not present

## 2016-08-20 LAB — COMPREHENSIVE METABOLIC PANEL
ALT: 17 U/L (ref 6–29)
AST: 18 U/L (ref 10–35)
Albumin: 4.3 g/dL (ref 3.6–5.1)
Alkaline Phosphatase: 68 U/L (ref 33–115)
BUN: 11 mg/dL (ref 7–25)
CALCIUM: 8.8 mg/dL (ref 8.6–10.2)
CHLORIDE: 98 mmol/L (ref 98–110)
CO2: 23 mmol/L (ref 20–31)
Creat: 0.8 mg/dL (ref 0.50–1.10)
GLUCOSE: 77 mg/dL (ref 65–99)
POTASSIUM: 3.8 mmol/L (ref 3.5–5.3)
Sodium: 135 mmol/L (ref 135–146)
Total Bilirubin: 1 mg/dL (ref 0.2–1.2)
Total Protein: 7 g/dL (ref 6.1–8.1)

## 2016-08-20 LAB — LIPID PANEL
CHOL/HDL RATIO: 3.4 ratio (ref <=5.0)
CHOLESTEROL: 225 mg/dL — AB (ref 125–200)
HDL: 67 mg/dL (ref >=46)
LDL Cholesterol: 138 mg/dL — ABNORMAL HIGH (ref <130)
TRIGLYCERIDES: 99 mg/dL (ref <150)
VLDL: 20 mg/dL (ref <30)

## 2016-08-20 LAB — CBC
HCT: 40.6 % (ref 35.0–45.0)
HEMOGLOBIN: 14 g/dL (ref 11.7–15.5)
MCH: 32.4 pg (ref 27.0–33.0)
MCHC: 34.5 g/dL (ref 32.0–36.0)
MCV: 94 fL (ref 80.0–100.0)
MPV: 9.3 fL (ref 7.5–12.5)
Platelets: 275 10*3/uL (ref 140–400)
RBC: 4.32 MIL/uL (ref 3.80–5.10)
RDW: 13.6 % (ref 11.0–15.0)
WBC: 7.6 10*3/uL (ref 3.8–10.8)

## 2016-08-20 LAB — TSH: TSH: 0.7 mIU/L

## 2016-08-20 NOTE — Progress Notes (Signed)
50 y.o. G1P0010 SingleAfrican AmericanF here for annual exam.  Cycles have been irregular since January. She has been cycling every 3 months since then. Bleeding 3-5 days. Can wear a super tampon all day. No change in hot flashes (tolerable), no night sweats. No vaginal dryness. Sexually active, no pain.  She states her breasts are firmer than they used to be. She has lost about 10 lbs. Not tender. She does breast self exams, no lumps.  She hasn't used contraception in a long time. Same partner x 23 years, lives together. She helped raise his children.  No h/o htn. Last check was 140/80. It has been up and down in the past. She can get it down with diet and exercise.     Patient's last menstrual period was 06/02/2016.          Sexually active: Yes.    The current method of family planning is none.    Exercising: Yes.    walk Smoker:  no  Health Maintenance: Pap:  08-10-15 WNL NEG HR HPV  History of abnormal Pap:  no MMG:  03-13-16 WNL Colonoscopy:  Never BMD:   Never TDaP:  04-29-12 Gardasil: N/A   reports that she has never smoked. She has never used smokeless tobacco. She reports that she drinks about 1.0 oz of alcohol per week . She reports that she does not use drugs.She works in Clinical biochemist. No children.   Past Medical History:  Diagnosis Date  . Bell's palsy 2003   left cheek  . STD (sexually transmitted disease) 1993   hx of syphillis  treated    Past Surgical History:  Procedure Laterality Date  . DILATION AND CURETTAGE OF UTERUS    . hysteroscopic resection  01/14/06   Removal of polyp and D&C    Current Outpatient Prescriptions  Medication Sig Dispense Refill  . loratadine (CLARITIN) 10 MG tablet Take 10 mg by mouth daily.    . Omega-3 Fatty Acids (FISH OIL) 1200 MG CAPS Take 2 capsules by mouth daily.     No current facility-administered medications for this visit.     Family History  Problem Relation Age of Onset  . Hypertension Mother   . Hypertension  Father     Review of Systems  Constitutional: Negative.   HENT: Negative.   Eyes: Negative.   Respiratory: Negative.   Cardiovascular: Negative.   Gastrointestinal: Negative.   Endocrine: Negative.   Genitourinary: Positive for menstrual problem.       Irregular menstrual cycle   Musculoskeletal: Negative.   Skin: Negative.   Allergic/Immunologic: Negative.   Neurological: Negative.   Psychiatric/Behavioral: Negative.     Exam:   BP (!) 162/90 (BP Location: Right Arm, Patient Position: Sitting, Cuff Size: Normal)   Pulse 72   Resp 15   Ht 5' 1.75" (1.568 m)   Wt 172 lb (78 kg)   LMP 06/02/2016   BMI 31.71 kg/m   Weight change: @WEIGHTCHANGE @ Height:   Height: 5' 1.75" (156.8 cm)  Ht Readings from Last 3 Encounters:  08/20/16 5' 1.75" (1.568 m)  12/02/15 5' 1.75" (1.568 m)  09/28/15 5' 1.75" (1.568 m)    General appearance: alert, cooperative and appears stated age Head: Normocephalic, without obvious abnormality, atraumatic Neck: no adenopathy, supple, symmetrical, trachea midline and thyroid normal to inspection and palpation Lungs: clear to auscultation bilaterally Breasts: normal appearance, no masses or tenderness Heart: regular rate and rhythm Abdomen: soft, non-tender; bowel sounds normal; no masses,  no organomegaly  Extremities: extremities normal, atraumatic, no cyanosis or edema Skin: Skin color, texture, turgor normal. No rashes or lesions Lymph nodes: Cervical, supraclavicular, and axillary nodes normal. No abnormal inguinal nodes palpated Neurologic: Grossly normal   Pelvic: External genitalia:  no lesions              Urethra:  normal appearing urethra with no masses, tenderness or lesions              Bartholins and Skenes: normal                 Vagina: normal appearing vagina with normal color and discharge, no lesions              Cervix: no lesions               Bimanual Exam:  Uterus:  normal size, contour, position, consistency, mobility,  non-tender              Adnexa: no mass, fullness, tenderness               Rectovaginal: Confirms               Anus:  normal sphincter tone, no lesions  Chaperone was present for exam.  A:  Well Woman with normal exam  Perimenopausal  Irregular bleeding, oligomenorrhea (c/w perimenopause)  Elevated BP, no diagnosis of HTN   P:   No pap this year  Discussed cyclic provera, she will call if she wants to use it  Discussed parameters of when to call  Mammogram UTD  Discussed breast self exams and vit d  Screening labs  Will recheck her BP today, encouraged her to check her BP at home and f/u with primary MD

## 2016-08-20 NOTE — Patient Instructions (Signed)
EXERCISE AND DIET:  We recommended that you start or continue a regular exercise program for good health. Regular exercise means any activity that makes your heart beat faster and makes you sweat.  We recommend exercising at least 30 minutes per day at least 3 days a week, preferably 4 or 5.  We also recommend a diet low in fat and sugar.  Inactivity, poor dietary choices and obesity can cause diabetes, heart attack, stroke, and kidney damage, among others.    ALCOHOL AND SMOKING:  Women should limit their alcohol intake to no more than 7 drinks/beers/glasses of wine (combined, not each!) per week. Moderation of alcohol intake to this level decreases your risk of breast cancer and liver damage. And of course, no recreational drugs are part of a healthy lifestyle.  And absolutely no smoking or even second hand smoke. Most people know smoking can cause heart and lung diseases, but did you know it also contributes to weakening of your bones? Aging of your skin?  Yellowing of your teeth and nails?  CALCIUM AND VITAMIN D:  Adequate intake of calcium and Vitamin D are recommended.  The recommendations for exact amounts of these supplements seem to change often, but generally speaking 600 mg of calcium (either carbonate or citrate) and 800 units of Vitamin D per day seems prudent. Certain women may benefit from higher intake of Vitamin D.  If you are among these women, your doctor will have told you during your visit.    PAP SMEARS:  Pap smears, to check for cervical cancer or precancers,  have traditionally been done yearly, although recent scientific advances have shown that most women can have pap smears less often.  However, every woman still should have a physical exam from her gynecologist every year. It will include a breast check, inspection of the vulva and vagina to check for abnormal growths or skin changes, a visual exam of the cervix, and then an exam to evaluate the size and shape of the uterus and  ovaries.  And after 49 years of age, a rectal exam is indicated to check for rectal cancers. We will also provide age appropriate advice regarding health maintenance, like when you should have certain vaccines, screening for sexually transmitted diseases, bone density testing, colonoscopy, mammograms, etc.   MAMMOGRAMS:  All women over 40 years old should have a yearly mammogram. Many facilities now offer a "3D" mammogram, which may cost around $50 extra out of pocket. If possible,  we recommend you accept the option to have the 3D mammogram performed.  It both reduces the number of women who will be called back for extra views which then turn out to be normal, and it is better than the routine mammogram at detecting truly abnormal areas.    COLONOSCOPY:  Colonoscopy to screen for colon cancer is recommended for all women at age 50.  We know, you hate the idea of the prep.  We agree, BUT, having colon cancer and not knowing it is worse!!  Colon cancer so often starts as a polyp that can be seen and removed at colonscopy, which can quite literally save your life!  And if your first colonoscopy is normal and you have no family history of colon cancer, most women don't have to have it again for 10 years.  Once every ten years, you can do something that may end up saving your life, right?  We will be happy to help you get it scheduled when you are ready.    Be sure to check your insurance coverage so you understand how much it will cost.  It may be covered as a preventative service at no cost, but you should check your particular policy.     Perimenopause Perimenopause is the time when your body begins to move into the menopause (no menstrual period for 12 straight months). It is a natural process. Perimenopause can begin 2-8 years before the menopause and usually lasts for 1 year after the menopause. During this time, your ovaries may or may not produce an egg. The ovaries vary in their production of estrogen and  progesterone hormones each month. This can cause irregular menstrual periods, difficulty getting pregnant, vaginal bleeding between periods, and uncomfortable symptoms. CAUSES  Irregular production of the ovarian hormones, estrogen and progesterone, and not ovulating every month.  Other causes include:  Tumor of the pituitary gland in the brain.  Medical disease that affects the ovaries.  Radiation treatment.  Chemotherapy.  Unknown causes.  Heavy smoking and excessive alcohol intake can bring on perimenopause sooner. SIGNS AND SYMPTOMS   Hot flashes.  Night sweats.  Irregular menstrual periods.  Decreased sex drive.  Vaginal dryness.  Headaches.  Mood swings.  Depression.  Memory problems.  Irritability.  Tiredness.  Weight gain.  Trouble getting pregnant.  The beginning of losing bone cells (osteoporosis).  The beginning of hardening of the arteries (atherosclerosis). DIAGNOSIS  Your health care provider will make a diagnosis by analyzing your age, menstrual history, and symptoms. He or she will do a physical exam and note any changes in your body, especially your female organs. Female hormone tests may or may not be helpful depending on the amount of female hormones you produce and when you produce them. However, other hormone tests may be helpful to rule out other problems. TREATMENT  In some cases, no treatment is needed. The decision on whether treatment is necessary during the perimenopause should be made by you and your health care provider based on how the symptoms are affecting you and your lifestyle. Various treatments are available, such as:  Treating individual symptoms with a specific medicine for that symptom.  Herbal medicines that can help specific symptoms.  Counseling.  Group therapy. HOME CARE INSTRUCTIONS   Keep track of your menstrual periods (when they occur, how heavy they are, how long between periods, and how long they last) as  well as your symptoms and when they started.  Only take over-the-counter or prescription medicines as directed by your health care provider.  Sleep and rest.  Exercise.  Eat a diet that contains calcium (good for your bones) and soy (acts like the estrogen hormone).  Do not smoke.  Avoid alcoholic beverages.  Take vitamin supplements as recommended by your health care provider. Taking vitamin E may help in certain cases.  Take calcium and vitamin D supplements to help prevent bone loss.  Group therapy is sometimes helpful.  Acupuncture may help in some cases. SEEK MEDICAL CARE IF:   You have questions about any symptoms you are having.  You need a referral to a specialist (gynecologist, psychiatrist, or psychologist). SEEK IMMEDIATE MEDICAL CARE IF:   You have vaginal bleeding.  Your period lasts longer than 8 days.  Your periods are recurring sooner than 21 days.  You have bleeding after intercourse.  You have severe depression.  You have pain when you urinate.  You have severe headaches.  You have vision problems.   This information is not intended to replace advice   given to you by your health care provider. Make sure you discuss any questions you have with your health care provider.   Document Released: 12/13/2004 Document Revised: 11/26/2014 Document Reviewed: 06/04/2013 Elsevier Interactive Patient Education 2016 Elsevier Inc.  

## 2016-08-20 NOTE — Telephone Encounter (Signed)
Left patient a message to call back to reschedule a future appointment that was cancelled by the provider. °

## 2016-08-21 LAB — VITAMIN D 25 HYDROXY (VIT D DEFICIENCY, FRACTURES): Vit D, 25-Hydroxy: 24 ng/mL — ABNORMAL LOW (ref 30–100)

## 2016-08-21 LAB — FOLLICLE STIMULATING HORMONE: FSH: 5.8 m[IU]/mL

## 2016-08-22 ENCOUNTER — Telehealth: Payer: Self-pay | Admitting: *Deleted

## 2016-08-22 NOTE — Telephone Encounter (Signed)
-----   Message from Romualdo BolkJill Evelyn Jertson, MD sent at 08/22/2016  1:24 PM EDT ----- Please inform the patient that [facility] the pre[facility] range (can go up and down in the menopause transition). Her vit D level is low, she should be taking 1,000 IU of vit D3 daily (long term). Her total cholesterol and LDL are both slightly elevated (similar to last year), the rest of her lipid panel is normal. She should eat a diet low in saturated fats, exercise regularly and have a f/u fasting lipid panel in one year.  The rest of her blood work is normal.

## 2016-08-22 NOTE — Telephone Encounter (Signed)
Left message to call regarding lab results -eh 

## 2016-08-22 NOTE — Telephone Encounter (Signed)
Spoke with patient and went over results in detail. Patient voiced understanding -eh

## 2016-08-29 DIAGNOSIS — R030 Elevated blood-pressure reading, without diagnosis of hypertension: Secondary | ICD-10-CM | POA: Diagnosis not present

## 2017-01-15 DIAGNOSIS — Z131 Encounter for screening for diabetes mellitus: Secondary | ICD-10-CM | POA: Diagnosis not present

## 2017-01-15 DIAGNOSIS — E785 Hyperlipidemia, unspecified: Secondary | ICD-10-CM | POA: Diagnosis not present

## 2017-01-17 ENCOUNTER — Telehealth: Payer: Self-pay | Admitting: Behavioral Health

## 2017-01-17 NOTE — Telephone Encounter (Signed)
Patient cancelled appointment for tomorrow (01/18/17) and would like to call back at a later date to reschedule.

## 2017-01-18 ENCOUNTER — Ambulatory Visit: Payer: BLUE CROSS/BLUE SHIELD | Admitting: Family

## 2017-01-18 DIAGNOSIS — Z0000 Encounter for general adult medical examination without abnormal findings: Secondary | ICD-10-CM | POA: Diagnosis not present

## 2017-02-04 ENCOUNTER — Other Ambulatory Visit: Payer: Self-pay | Admitting: Nurse Practitioner

## 2017-02-04 DIAGNOSIS — Z1231 Encounter for screening mammogram for malignant neoplasm of breast: Secondary | ICD-10-CM

## 2017-02-06 DIAGNOSIS — J0100 Acute maxillary sinusitis, unspecified: Secondary | ICD-10-CM | POA: Diagnosis not present

## 2017-03-18 ENCOUNTER — Ambulatory Visit
Admission: RE | Admit: 2017-03-18 | Discharge: 2017-03-18 | Disposition: A | Discharge status: Home / Self Care | Payer: BLUE CROSS/BLUE SHIELD | Source: Ambulatory Visit | Attending: Nurse Practitioner | Admitting: Nurse Practitioner

## 2017-03-18 DIAGNOSIS — Z1231 Encounter for screening mammogram for malignant neoplasm of breast: Secondary | ICD-10-CM | POA: Diagnosis not present

## 2017-05-02 ENCOUNTER — Telehealth: Payer: Self-pay | Admitting: Nurse Practitioner

## 2017-05-02 DIAGNOSIS — N912 Amenorrhea, unspecified: Secondary | ICD-10-CM

## 2017-05-02 NOTE — Telephone Encounter (Signed)
She can take provera for 5 days, she can take prometrium 400 mg qhs x 10 days, or she can just wait and see what happens.

## 2017-05-02 NOTE — Telephone Encounter (Signed)
She can have a FSH checked. Just warn her that it can go up and down in menopause. If low it will be helpful to let us know she is not menopausal.

## 2017-05-02 NOTE — Telephone Encounter (Signed)
Spoke with patient. Patient states that she has not had a menses since 01/28/2017. Denies any spotting or light bleeding since. Patient had FSH checked 08/2016 which showed she was perimenopausal. Explained Provera challenge to patient who states that she  Spoke with Dr.Jertson about this at her aex 08/2016 and does not want to take Provera. States she understands the reasoning behind taking it, but does not want to take any hormone medication. "She told me there were alternatives we could do." Advised I will speak with Dr.Jertson and return call.

## 2017-05-02 NOTE — Telephone Encounter (Signed)
Patient would like to speak with a nurse.  States she has been bleeding for 3 months.

## 2017-05-02 NOTE — Telephone Encounter (Signed)
Spoke with patient. Advised of message as seen below from Dr.Jertson. Patient is agreeable and verbalizes understanding. Lab appointment scheduled for 05/07/2017 at 3:30 pm. Order placed.  Routing to provider for final review. Patient agreeable to disposition. Will close encounter.

## 2017-05-02 NOTE — Telephone Encounter (Signed)
Spoke with patient. Advised of message as seen below from Dr.Jertson. Patient verbalizes understanding. States she would like to monitor at this time. Asking if she can have lab work to check hormone levels at this time.   Dr.Jertson, okay for patient to have FSH level checked? I have advised patient this does not take place of taking progesterone or give an indication of her uterine lining and she verbalizes understanding. Wants to know where she is at with entering menopause.

## 2017-05-07 ENCOUNTER — Other Ambulatory Visit (INDEPENDENT_AMBULATORY_CARE_PROVIDER_SITE_OTHER): Payer: BLUE CROSS/BLUE SHIELD

## 2017-05-07 DIAGNOSIS — N912 Amenorrhea, unspecified: Secondary | ICD-10-CM | POA: Diagnosis not present

## 2017-05-08 ENCOUNTER — Telehealth: Payer: Self-pay | Admitting: Nurse Practitioner

## 2017-05-08 LAB — FOLLICLE STIMULATING HORMONE: FSH: 82.5 m[IU]/mL

## 2017-05-08 NOTE — Telephone Encounter (Signed)
Left message to call regarding lab results -eh 

## 2017-05-08 NOTE — Telephone Encounter (Signed)
Patient requesting lab results

## 2017-05-08 NOTE — Telephone Encounter (Signed)
Routing to Dr.Jertson for review and advise. 

## 2017-05-08 NOTE — Telephone Encounter (Signed)
-----   Message from Romualdo BolkJill Evelyn Jertson, MD sent at 05/08/2017  4:24 PM EDT ----- Please let the patient know that [facility] range, but that it can fluctuate.

## 2017-06-10 DIAGNOSIS — H40013 Open angle with borderline findings, low risk, bilateral: Secondary | ICD-10-CM | POA: Diagnosis not present

## 2017-06-14 NOTE — Telephone Encounter (Signed)
Ok to close encounter. 

## 2017-06-21 DIAGNOSIS — H401131 Primary open-angle glaucoma, bilateral, mild stage: Secondary | ICD-10-CM | POA: Diagnosis not present

## 2017-08-29 ENCOUNTER — Encounter: Payer: Self-pay | Admitting: Certified Nurse Midwife

## 2017-08-29 ENCOUNTER — Ambulatory Visit (INDEPENDENT_AMBULATORY_CARE_PROVIDER_SITE_OTHER): Payer: BLUE CROSS/BLUE SHIELD | Admitting: Certified Nurse Midwife

## 2017-08-29 VITALS — BP 160/98 | HR 68 | Resp 16 | Ht 61.75 in | Wt 173.0 lb

## 2017-08-29 DIAGNOSIS — Z0000 Encounter for general adult medical examination without abnormal findings: Secondary | ICD-10-CM

## 2017-08-29 DIAGNOSIS — Z01419 Encounter for gynecological examination (general) (routine) without abnormal findings: Secondary | ICD-10-CM | POA: Diagnosis not present

## 2017-08-29 DIAGNOSIS — E559 Vitamin D deficiency, unspecified: Secondary | ICD-10-CM | POA: Diagnosis not present

## 2017-08-29 DIAGNOSIS — N951 Menopausal and female climacteric states: Secondary | ICD-10-CM

## 2017-08-29 NOTE — Progress Notes (Signed)
51 y.o. G1P0010 Single  African American Fe here for annual exam. No periods since 3/18. Some hot flashes and night sweats. No insomnia. Sees PCP [facility] for hypertension. No health issues in the past year. October her sad month due to previous miscarriage of only pregnancy. [place] soon. Aware her BP is up and is back on her medication. Recent visit with PCP for BP and had broken blood vessel in left eye. Plans to be more careful with medication.  No other health issues.  Patient's last menstrual period was 01/28/2017 (exact date).          Sexually active: No.  The current method of family planning is abstinence.    Exercising: Yes.    walking Smoker:  no  Health Maintenance: Pap:  08-10-15 neg HPV HR neg History of Abnormal Pap: no MMG:  03-18-17 category c density birads 1:neg Self Breast exams: yes Colonoscopy:  none BMD:   none TDaP:  2013 Shingles: no Pneumonia: no Hep C and HIV: HIV neg 2016 Labs: yes   reports that she has never smoked. She has never used smokeless tobacco. She reports that she drinks alcohol. She reports that she does not use drugs.  Past Medical History:  Diagnosis Date  . Bell's palsy 2003   left cheek  . Glaucoma   . Hypertension   . STD (sexually transmitted disease) 1993   hx of syphillis  treated    Past Surgical History:  Procedure Laterality Date  . DILATION AND CURETTAGE OF UTERUS    . hysteroscopic resection  01/14/06   Removal of polyp and D&C    Current Outpatient Prescriptions  Medication Sig Dispense Refill  . amLODipine (NORVASC) 5 MG tablet     . latanoprost (XALATAN) 0.005 % ophthalmic solution     . loratadine (CLARITIN) 10 MG tablet Take 10 mg by mouth daily.     No current facility-administered medications for this visit.     Family History  Problem Relation Age of Onset  . Hypertension Mother   . Glaucoma Mother   . Hypertension Father   . Glaucoma Maternal Grandmother   . Breast cancer Maternal Aunt      ROS:  Pertinent items are noted in HPI.  Otherwise, a comprehensive ROS was negative.  Exam:   BP (!) 160/98   Pulse 68   Resp 16   Ht 5' 1.75" (1.568 m)   Wt 173 lb (78.5 kg)   LMP 01/28/2017 (Exact Date)   BMI 31.90 kg/m  Height: 5' 1.75" (156.8 cm) Ht Readings from Last 3 Encounters:  08/29/17 5' 1.75" (1.568 m)  08/20/16 5' 1.75" (1.568 m)  12/02/15 5' 1.75" (1.568 m)    General appearance: alert, cooperative and appears stated age Head: Normocephalic, without obvious abnormality, atraumatic Neck: no adenopathy, supple, symmetrical, trachea midline and thyroid normal to inspection and palpation Lungs: clear to auscultation bilaterally Breasts: normal appearance, no masses or tenderness, No nipple retraction or dimpling, No nipple discharge or bleeding, No axillary or supraclavicular adenopathy Heart: regular rate and rhythm Abdomen: soft, non-tender; no masses,  no organomegaly Extremities: extremities normal, atraumatic, no cyanosis or edema Skin: Skin color, texture, turgor normal. No rashes or lesions Lymph nodes: Cervical, supraclavicular, and axillary nodes normal. No abnormal inguinal nodes palpated Neurologic: Grossly normal   Pelvic: External genitalia:  no lesions              Urethra:  normal appearing urethra with no masses, tenderness or  lesions              Bartholin's and Skene's: normal                 Vagina: normal appearing vagina with normal color and discharge, no lesions              Cervix: no cervical motion tenderness, no lesions and nulliparous appearance              Pap taken: No. Bimanual Exam:  Uterus:  normal size, contour, position, consistency, mobility, non-tender              Adnexa: normal adnexa and no mass, fullness, tenderness               Rectovaginal: Confirms               Anus:  normal sphincter tone, no lesions  Chaperone present: yes  A:  Well Woman with normal exam  Perimenopausal/menopausal with amenorrhea  Social  stress with anniversary of miscarriage  Hypertension with PCP management  Vitamin D deficiency  P:   Reviewed health and wellness pertinent to exam  Patient aware FSH is menopausal level and may or may not have another period. Once she is at one year.3/19 she is menopausal and if vaginal bleeding will need to advise.   Discussed doing something special to remember that time that is joyful.  Continue follow up with PCP as indicated  Lab: Vitamin  D, TSH  Pap smear: no   counseled on breast self exam, mammography screening, adequate intake of calcium and vitamin D, diet and exercise  return annually or prn  An After Visit Summary was printed and given to the patient.

## 2017-08-29 NOTE — Patient Instructions (Signed)

## 2017-08-30 ENCOUNTER — Other Ambulatory Visit: Payer: Self-pay | Admitting: Certified Nurse Midwife

## 2017-08-30 DIAGNOSIS — E559 Vitamin D deficiency, unspecified: Secondary | ICD-10-CM

## 2017-08-30 LAB — VITAMIN D 25 HYDROXY (VIT D DEFICIENCY, FRACTURES): VIT D 25 HYDROXY: 19.4 ng/mL — AB (ref 30.0–100.0)

## 2017-08-30 LAB — TSH: TSH: 0.71 u[IU]/mL (ref 0.450–4.500)

## 2017-11-01 ENCOUNTER — Other Ambulatory Visit (INDEPENDENT_AMBULATORY_CARE_PROVIDER_SITE_OTHER): Payer: BLUE CROSS/BLUE SHIELD

## 2017-11-01 DIAGNOSIS — E559 Vitamin D deficiency, unspecified: Secondary | ICD-10-CM

## 2017-11-02 LAB — VITAMIN D 25 HYDROXY (VIT D DEFICIENCY, FRACTURES): Vit D, 25-Hydroxy: 27.8 ng/mL — ABNORMAL LOW (ref 30.0–100.0)

## 2017-11-27 DIAGNOSIS — H40053 Ocular hypertension, bilateral: Secondary | ICD-10-CM | POA: Diagnosis not present

## 2017-12-04 DIAGNOSIS — E785 Hyperlipidemia, unspecified: Secondary | ICD-10-CM | POA: Diagnosis not present

## 2017-12-04 DIAGNOSIS — I10 Essential (primary) hypertension: Secondary | ICD-10-CM | POA: Diagnosis not present

## 2018-01-30 ENCOUNTER — Telehealth: Payer: Self-pay | Admitting: Obstetrics and Gynecology

## 2018-01-30 NOTE — Telephone Encounter (Signed)
Left message to call Kaitlyn at [phone]. 

## 2018-01-30 NOTE — Telephone Encounter (Signed)
Spoke with patient. LMP was 01/28/2017. No menses since. Woke up this morning with light spotting. Denies any heavy bleeding, cramping, or pain. Last FSH in 04/2017 was 82.5. Advised will need to be seen in the office for further evaluation. Patient is agreeable. States that she is only available to be seen late afternoon. Appointment scheduled for 02/04/2018 at 3:45 pm with Dr.Jertson. Aware if symptoms worsen or change will need to be seen for further evaluation earlier. Patient is agreeable.  Routing to provider for final review. Patient agreeable to disposition. Will close encounter.

## 2018-01-30 NOTE — Telephone Encounter (Signed)
Patient called requesting to speak with the nurse about having a menstrual cycle after not having one for a year.  Last seen: 08/29/17

## 2018-02-04 ENCOUNTER — Other Ambulatory Visit (HOSPITAL_COMMUNITY)
Admission: RE | Admit: 2018-02-04 | Discharge: 2018-02-04 | Disposition: A | Discharge status: Home / Self Care | Payer: BLUE CROSS/BLUE SHIELD | Source: Ambulatory Visit | Attending: Obstetrics and Gynecology | Admitting: Obstetrics and Gynecology

## 2018-02-04 ENCOUNTER — Other Ambulatory Visit: Payer: Self-pay

## 2018-02-04 ENCOUNTER — Ambulatory Visit (INDEPENDENT_AMBULATORY_CARE_PROVIDER_SITE_OTHER): Payer: BLUE CROSS/BLUE SHIELD | Admitting: Obstetrics and Gynecology

## 2018-02-04 ENCOUNTER — Encounter: Payer: Self-pay | Admitting: Obstetrics and Gynecology

## 2018-02-04 VITALS — BP 164/98 | HR 80 | Resp 14 | Wt 178.0 lb

## 2018-02-04 DIAGNOSIS — Z124 Encounter for screening for malignant neoplasm of cervix: Secondary | ICD-10-CM | POA: Insufficient documentation

## 2018-02-04 DIAGNOSIS — N939 Abnormal uterine and vaginal bleeding, unspecified: Secondary | ICD-10-CM

## 2018-02-04 LAB — POCT URINE PREGNANCY: PREG TEST UR: NEGATIVE

## 2018-02-04 NOTE — Progress Notes (Signed)
GYNECOLOGY  VISIT   HPI: 52 y.o.   Single  African American  female   G1P0010 with Patient's last menstrual period was 01/28/2018.   here c/o abnormal uterine bleeding   She had her last cycle on 01/28/17. Last month 3 days of spotting, not associated with intercourse. This month she started spotting on 3/13 and is still spotting. Never heavy. Slight cramping last week.  No bowel or bladder c/o.  FSH was 82.5 in 6/18.   GYNECOLOGIC HISTORY: Patient's last menstrual period was 01/28/2018. Contraception:none Menopausal hormone therapy: none         OB History    Gravida Para Term Preterm AB Living   1 0 0 0 1 0   SAB TAB Ectopic Multiple Live Births   1 0 0 0           There are no active problems to display for this patient.   Past Medical History:  Diagnosis Date  . Bell's palsy 2003   left cheek  . Glaucoma   . Hypertension   . STD (sexually transmitted disease) 1993   hx of syphillis  treated    Past Surgical History:  Procedure Laterality Date  . DILATION AND CURETTAGE OF UTERUS    . hysteroscopic resection  01/14/06   Removal of polyp and D&C    Current Outpatient Medications  Medication Sig Dispense Refill  . amLODipine (NORVASC) 5 MG tablet     . latanoprost (XALATAN) 0.005 % ophthalmic solution      No current facility-administered medications for this visit.      ALLERGIES: Amoxicillin  Family History  Problem Relation Age of Onset  . Hypertension Mother   . Glaucoma Mother   . Hypertension Father   . Glaucoma Maternal Grandmother   . Breast cancer Maternal Aunt     Social History   Socioeconomic History  . Marital status: Single    Spouse name: Not on file  . Number of children: Not on file  . Years of education: Not on file  . Highest education level: Not on file  Social Needs  . Financial resource strain: Not on file  . Food insecurity - worry: Not on file  . Food insecurity - inability: Not on file  . Transportation needs - medical:  Not on file  . Transportation needs - non-medical: Not on file  Occupational History  . Not on file  Tobacco Use  . Smoking status: Never Smoker  . Smokeless tobacco: Never Used  Substance and Sexual Activity  . Alcohol use: Yes    Comment: 2-3 a day  . Drug use: No  . Sexual activity: No    Partners: Male    Birth control/protection: None  Other Topics Concern  . Not on file  Social History Narrative  . Not on file    Review of Systems  Constitutional: Negative.   HENT: Negative.   Eyes: Negative.   Respiratory: Negative.   Cardiovascular: Negative.   Gastrointestinal: Negative.   Genitourinary:       Abnormal uterine bleeding   Musculoskeletal: Negative.   Skin: Negative.   Neurological: Negative.   Endo/Heme/Allergies: Negative.   Psychiatric/Behavioral: Negative.     PHYSICAL EXAMINATION:    BP (!) 164/98 (BP Location: Right Arm, Patient Position: Sitting, Cuff Size: Normal)   Pulse 80   Resp 14   Wt 178 lb (80.7 kg)   LMP 01/28/2018   BMI 32.82 kg/m     General appearance: alert,  cooperative and appears stated age Neck: no adenopathy, supple, symmetrical, trachea midline and thyroid normal to inspection and palpation Abdomen: soft, non-tender; non distended, no masses,  no organomegaly  Pelvic: External genitalia:  no lesions              Urethra:  normal appearing urethra with no masses, tenderness or lesions              Bartholins and Skenes: normal                 Vagina: normal appearing vagina with normal color and discharge, no lesions              Cervix: no lesions and blood noted to be coming from within the cervix              Bimanual Exam:  Uterus:  normal size, contour, position, consistency, mobility, non-tender and anteverted              Adnexa: no mass, fullness, tenderness                The risks of endometrial biopsy were reviewed and a consent was obtained.  A speculum was placed in the vagina and the cervix was cleansed with  betadine. A  mini-pipelle was placed into the endometrial cavity. The uterus sounded to 7 cm. The endometrial biopsy was performed, moderate tissue was obtained. The tenaculum and speculum were removed. There were no complications.    Chaperone was present for exam.  ASSESSMENT AUB after 11 months of amenorrhea. Now spotting 2 months in a row. Prior high FSH    PLAN UPT negative FSH Endometrial biopsy Depending on results, may set her up for a u/s, possible sonohysterogram Pap with hpv   An After Visit Summary was printed and given to the patient.  CC: Sara Chu, CNM

## 2018-02-04 NOTE — Patient Instructions (Signed)

## 2018-02-05 LAB — FOLLICLE STIMULATING HORMONE: FSH: 34.4 m[IU]/mL

## 2018-02-06 LAB — CYTOLOGY - PAP
Diagnosis: NEGATIVE
HPV (WINDOPATH): NOT DETECTED

## 2018-02-12 ENCOUNTER — Telehealth: Payer: Self-pay

## 2018-02-12 NOTE — Telephone Encounter (Signed)
-----   Message from Romualdo BolkJill Evelyn Jertson, MD sent at 02/10/2018  5:06 PM EDT ----- Please inform the patient that her biopsy was benign, shows disordered endometrium. Still with hormonal stimulation. Please treat with provera 5 mg x 5 day now and every other moth x 6 months. Please have her calendar any bleeding and the dates she takes the provera and f/u in 6 months.

## 2018-02-13 NOTE — Telephone Encounter (Signed)
Spoke with patient. Advised of results as seen below from Dr.Jertson. Patient verbalizes understanding. States she does not want to take hormones. Advised of importance of taking hormones to have a menses due to hormonal stimulation without cycling regularly. Patient declines stating she is not going to do this. Asking if a D&C could be an alternative. Advised will review with Dr.Jertson and return call. Patient is agreeable.

## 2018-02-14 ENCOUNTER — Other Ambulatory Visit: Payer: Self-pay | Admitting: Nurse Practitioner

## 2018-02-14 DIAGNOSIS — Z139 Encounter for screening, unspecified: Secondary | ICD-10-CM

## 2018-02-18 DIAGNOSIS — E7800 Pure hypercholesterolemia, unspecified: Secondary | ICD-10-CM | POA: Diagnosis not present

## 2018-02-19 DIAGNOSIS — H401131 Primary open-angle glaucoma, bilateral, mild stage: Secondary | ICD-10-CM | POA: Diagnosis not present

## 2018-02-21 DIAGNOSIS — Z1211 Encounter for screening for malignant neoplasm of colon: Secondary | ICD-10-CM | POA: Diagnosis not present

## 2018-02-21 DIAGNOSIS — Z0000 Encounter for general adult medical examination without abnormal findings: Secondary | ICD-10-CM | POA: Diagnosis not present

## 2018-02-21 DIAGNOSIS — E7800 Pure hypercholesterolemia, unspecified: Secondary | ICD-10-CM | POA: Diagnosis not present

## 2018-02-22 NOTE — Telephone Encounter (Signed)
A D&C won't fix her hormonal fluctuations. She doesn't need to take the provera, but it is a very small amount for a very small amount of time. She would only be taking a max of 15 tablets over 6 months. What it does is empty out her uterine lining so it doesn't overgrow (which can potentially lead to abnormal cells). If she doesn't want to do this, she should just calendar her bleeding and f/u with Ms Darcel BayleyLeonard in 6 months to review. Please inform

## 2018-02-24 NOTE — Telephone Encounter (Signed)
Left message to call Kaitlyn at [phone]. 

## 2018-02-25 NOTE — Telephone Encounter (Signed)
Spoke with patient. Advised of message as seen below from Dr.Jertson. Patient verbalizes understanding. States she does not want to take Provera at this time. Will calendar bleeding for 6 months. Follow up scheduled with Leota Sauerseborah Leonard CNM on 08/29/2018 at 4 pm.  Cc; Leota Sauerseborah Leonard CNM  Routing to provider for final review. Patient agreeable to disposition. Will close encounter.

## 2018-03-31 ENCOUNTER — Ambulatory Visit: Payer: BLUE CROSS/BLUE SHIELD

## 2018-04-09 ENCOUNTER — Ambulatory Visit (INDEPENDENT_AMBULATORY_CARE_PROVIDER_SITE_OTHER): Payer: BLUE CROSS/BLUE SHIELD | Admitting: Certified Nurse Midwife

## 2018-04-09 ENCOUNTER — Other Ambulatory Visit: Payer: Self-pay

## 2018-04-09 ENCOUNTER — Encounter: Payer: Self-pay | Admitting: Certified Nurse Midwife

## 2018-04-09 VITALS — BP 120/78 | HR 68 | Temp 98.8°F | Resp 16 | Ht 61.75 in | Wt 173.0 lb

## 2018-04-09 DIAGNOSIS — N898 Other specified noninflammatory disorders of vagina: Secondary | ICD-10-CM

## 2018-04-09 DIAGNOSIS — R350 Frequency of micturition: Secondary | ICD-10-CM | POA: Diagnosis not present

## 2018-04-09 NOTE — Progress Notes (Signed)
52 y.o. Single African American female G1P0010 here with complaint of UTI, with onset  on 5 days ago, with urinary frequency.. Patient complaining of sensation of warmth after urination. Denies /urgency/ and pain with urination. Patient denies fever, chills, nausea or back pain. Changed soap in the past week also. Noted onset after sexual activity with vibrator use. Has seemed to become better each day and occasional in the last 24 hours..  Denies any vaginal symptoms.     Menopausal with ? vaginal dryness. Patient consuming adequate water intake. No other health issues today  Review of Systems  Constitutional: Negative for chills, fever and malaise/fatigue.  Respiratory: Negative.   Gastrointestinal: Negative for abdominal pain and vomiting.  Genitourinary: Positive for frequency. Negative for dysuria, flank pain, hematuria and urgency.       Decreasing occurence  Musculoskeletal: Negative for back pain.  Psychiatric/Behavioral: The patient is not nervous/anxious.     O: Healthy female WDWN Affect: Normal, orientation x 3 Skin : warm and dry CVAT: negative bilateral Abdomen: negative for suprapubic tenderness  Pelvic exam: External genital area: normal, no lesions, no scaling or exudate or redness Bladder,Urethra non tender, Urethral meatus: slight tenderness with touch, slight increase pink Vagina: slight odor to vaginal discharge, normal appearance  Affirm taken Cervix: normal, non tender Uterus:normal,non tender Adnexa: normal non tender, no fullness or masses   A: Normal pelvic exam Poct urine- neg R/O UTI due to symptoms R/O vaginal infection due to discharge change  P: Reviewed findings of normal pelvic exam except for increase in discharge and slight tenderness or urethral meatus(which could have come from sexual activity). Encouraged to increase water intake and cranberry juice. Will send urine out to make sure no issues and will treat if indicated [id number]   culture Reviewed warning signs and symptoms of UTI and need to advise if occurring. Encouraged to limit soda, tea, and coffee and be sure to increase water intake. Discussed vaginal discharge noted and importance of making sure this not the cause of the urinary issues. Lab: Affirm will treat if indicated. Stressed lubrication use with sexual activity and empty bladder before and after sexual activity. Questions addressed.   RV prn

## 2018-04-09 NOTE — Patient Instructions (Addendum)
[employer] use

## 2018-04-10 ENCOUNTER — Other Ambulatory Visit: Payer: Self-pay

## 2018-04-10 LAB — URINE CULTURE

## 2018-04-10 LAB — VAGINITIS/VAGINOSIS, DNA PROBE
Candida Species: POSITIVE — AB
GARDNERELLA VAGINALIS: NEGATIVE
Trichomonas vaginosis: NEGATIVE

## 2018-04-10 MED ORDER — TERCONAZOLE 0.4 % VA CREA: TOPICAL_CREAM | VAGINAL | 0 refills | Status: DC

## 2018-05-12 ENCOUNTER — Ambulatory Visit
Admission: RE | Admit: 2018-05-12 | Discharge: 2018-05-12 | Disposition: A | Discharge status: Home / Self Care | Payer: BLUE CROSS/BLUE SHIELD | Source: Ambulatory Visit | Attending: Nurse Practitioner | Admitting: Nurse Practitioner

## 2018-05-12 DIAGNOSIS — Z139 Encounter for screening, unspecified: Secondary | ICD-10-CM

## 2018-05-12 DIAGNOSIS — Z1231 Encounter for screening mammogram for malignant neoplasm of breast: Secondary | ICD-10-CM | POA: Diagnosis not present

## 2018-06-03 DIAGNOSIS — M7612 Psoas tendinitis, left hip: Secondary | ICD-10-CM | POA: Diagnosis not present

## 2018-06-03 DIAGNOSIS — M461 Sacroiliitis, not elsewhere classified: Secondary | ICD-10-CM | POA: Diagnosis not present

## 2018-06-03 DIAGNOSIS — M5417 Radiculopathy, lumbosacral region: Secondary | ICD-10-CM | POA: Diagnosis not present

## 2018-06-03 DIAGNOSIS — M62838 Other muscle spasm: Secondary | ICD-10-CM | POA: Diagnosis not present

## 2018-06-10 DIAGNOSIS — M7612 Psoas tendinitis, left hip: Secondary | ICD-10-CM | POA: Diagnosis not present

## 2018-06-10 DIAGNOSIS — M461 Sacroiliitis, not elsewhere classified: Secondary | ICD-10-CM | POA: Diagnosis not present

## 2018-06-10 DIAGNOSIS — M5417 Radiculopathy, lumbosacral region: Secondary | ICD-10-CM | POA: Diagnosis not present

## 2018-06-10 DIAGNOSIS — M62838 Other muscle spasm: Secondary | ICD-10-CM | POA: Diagnosis not present

## 2018-08-29 ENCOUNTER — Ambulatory Visit: Payer: BLUE CROSS/BLUE SHIELD | Admitting: Certified Nurse Midwife

## 2018-09-02 ENCOUNTER — Other Ambulatory Visit: Payer: Self-pay

## 2018-09-02 ENCOUNTER — Ambulatory Visit (INDEPENDENT_AMBULATORY_CARE_PROVIDER_SITE_OTHER): Payer: BLUE CROSS/BLUE SHIELD | Admitting: Certified Nurse Midwife

## 2018-09-02 ENCOUNTER — Encounter: Payer: Self-pay | Admitting: Certified Nurse Midwife

## 2018-09-02 VITALS — BP 120/80 | HR 70 | Resp 16 | Ht 61.75 in | Wt 178.0 lb

## 2018-09-02 DIAGNOSIS — Z1211 Encounter for screening for malignant neoplasm of colon: Secondary | ICD-10-CM | POA: Diagnosis not present

## 2018-09-02 DIAGNOSIS — I10 Essential (primary) hypertension: Secondary | ICD-10-CM

## 2018-09-02 DIAGNOSIS — N951 Menopausal and female climacteric states: Secondary | ICD-10-CM | POA: Diagnosis not present

## 2018-09-02 DIAGNOSIS — Z8679 Personal history of other diseases of the circulatory system: Secondary | ICD-10-CM | POA: Diagnosis not present

## 2018-09-02 DIAGNOSIS — Z01419 Encounter for gynecological examination (general) (routine) without abnormal findings: Secondary | ICD-10-CM | POA: Diagnosis not present

## 2018-09-02 NOTE — Progress Notes (Signed)
52 y.o. G1P0010 Single  African American Fe here for annual exam. Periods light with occasional cramping and irregular. June, July, August no period and spotting September, normal for October with 5 day duration. Contraception Abstinence. Rosemarie Ax MD for hypertension, labs, aex. Feels much better on hypertensive medication now, dreaded starting it, but being consistent about use. No other health issues today or STD screening desired.  Patient's last menstrual period was 08/19/2018 (exact date).          Sexually active: No.  The current method of family planning is abstinence.    Exercising: No.  exercise Smoker:  no  Review of Systems  Constitutional: Negative.   HENT: Negative.   Eyes: Negative.   Respiratory: Negative.   Cardiovascular: Negative.   Gastrointestinal: Negative.   Genitourinary: Negative.   Musculoskeletal: Negative.   Skin: Negative.   Neurological: Negative.   Endo/Heme/Allergies: Negative.   Psychiatric/Behavioral: Negative.     Health Maintenance: Pap:  08-10-15 neg HPV HR neg, 02-04-18 neg HPV HR neg History of Abnormal Pap: no MMG:  05-12-18 category c density birads 1:neg Self Breast exams: yes Colonoscopy:  None requests referral BMD:   none TDaP:  2013 Shingles: no Pneumonia: no Hep C and HIV: HIV neg 2016 Labs: if needed.   reports that she has never smoked. She has never used smokeless tobacco. She reports that she drinks about 5.0 standard drinks of alcohol per week. She reports that she does not use drugs.  Past Medical History:  Diagnosis Date  . Bell's palsy 2003   left cheek  . Glaucoma   . Hypertension   . STD (sexually transmitted disease) 1993   hx of syphillis  treated    Past Surgical History:  Procedure Laterality Date  . DILATION AND CURETTAGE OF UTERUS    . hysteroscopic resection  01/14/06   Removal of polyp and D&C    Current Outpatient Medications  Medication Sig Dispense Refill  . amLODipine (NORVASC) 10 MG tablet  Take 10 mg by mouth daily.  1  . Cholecalciferol (VITAMIN D PO) Take by mouth.    Marland Kitchen GARLIC PO Take by mouth.    . latanoprost (XALATAN) 0.005 % ophthalmic solution     . Omega-3 Fatty Acids (FISH OIL PO) Take by mouth.     No current facility-administered medications for this visit.     Family History  Problem Relation Age of Onset  . Hypertension Mother   . Glaucoma Mother   . Hypertension Father   . Glaucoma Maternal Grandmother   . Breast cancer Maternal Aunt     ROS:  Pertinent items are noted in HPI.  Otherwise, a comprehensive ROS was negative.  Exam:   BP 120/80   Pulse 70   Resp 16   Ht 5' 1.75" (1.568 m)   Wt 178 lb (80.7 kg)   LMP 08/19/2018 (Exact Date)   BMI 32.82 kg/m  Height: 5' 1.75" (156.8 cm) Ht Readings from Last 3 Encounters:  09/02/18 5' 1.75" (1.568 m)  04/09/18 5' 1.75" (1.568 m)  08/29/17 5' 1.75" (1.568 m)    General appearance: alert, cooperative and appears stated age Head: Normocephalic, without obvious abnormality, atraumatic Neck: no adenopathy, supple, symmetrical, trachea midline and thyroid normal to inspection and palpation Lungs: clear to auscultation bilaterally Breasts: normal appearance, no masses or tenderness, No nipple retraction or dimpling, No nipple discharge or bleeding, No axillary or supraclavicular adenopathy Heart: regular rate and rhythm Abdomen: soft, non-tender; no masses,  no organomegaly Extremities: extremities normal, atraumatic, no cyanosis or edema Skin: Skin color, texture, turgor normal. No rashes or lesions Lymph nodes: Cervical, supraclavicular, and axillary nodes normal. No abnormal inguinal nodes palpated Neurologic: Grossly normal   Pelvic: External genitalia:  no lesions              Urethra:  normal appearing urethra with no masses, tenderness or lesions              Bartholin's and Skene's: normal                 Vagina: normal appearing vagina with normal color and discharge, no lesions               Cervix: no cervical motion tenderness, no lesions and normal appearance              Pap taken: No. Bimanual Exam:  Uterus:  normal size, contour, position, consistency, mobility, non-tender and anteverted              Adnexa: normal adnexa and no mass, fullness, tenderness               Rectovaginal: Confirms               Anus:  normal sphincter tone, no lesions  Chaperone present: yes  A:  Well Woman with normal exam  Contraception Abstinence  Perimenopausal with menstrual cycle change, using Provera every other month, completed last one in 9/19, with spotting and normal period in 10/19  Hypertension on medication with PCP management  Colonoscopy due      P:   Reviewed health and wellness pertinent to exam  Discussed importance of notifying if not menses in next 3 months.  Continue follow up with PCP as indicated.  Discussed risks/benefits of colonoscopy. Requests referral. Patient will be called with information.  Pap smear: no   counseled on breast self exam, mammography screening, STD prevention, feminine hygiene, adequate intake of calcium and vitamin D, diet and exercise  return annually or prn  An After Visit Summary was printed and given to the patient.

## 2018-09-02 NOTE — Patient Instructions (Signed)

## 2018-09-24 DIAGNOSIS — H401131 Primary open-angle glaucoma, bilateral, mild stage: Secondary | ICD-10-CM | POA: Diagnosis not present

## 2018-10-02 DIAGNOSIS — Z800 Family history of malignant neoplasm of digestive organs: Secondary | ICD-10-CM | POA: Diagnosis not present

## 2018-10-02 DIAGNOSIS — Z1211 Encounter for screening for malignant neoplasm of colon: Secondary | ICD-10-CM | POA: Diagnosis not present

## 2018-10-02 DIAGNOSIS — Z86010 Personal history of colonic polyps: Secondary | ICD-10-CM | POA: Diagnosis not present

## 2018-10-06 ENCOUNTER — Other Ambulatory Visit: Payer: Self-pay | Admitting: Gastroenterology

## 2018-10-06 DIAGNOSIS — R945 Abnormal results of liver function studies: Principal | ICD-10-CM

## 2018-10-06 DIAGNOSIS — H401131 Primary open-angle glaucoma, bilateral, mild stage: Secondary | ICD-10-CM | POA: Diagnosis not present

## 2018-10-06 DIAGNOSIS — R7989 Other specified abnormal findings of blood chemistry: Secondary | ICD-10-CM

## 2018-10-21 DIAGNOSIS — H401131 Primary open-angle glaucoma, bilateral, mild stage: Secondary | ICD-10-CM | POA: Diagnosis not present

## 2018-11-14 ENCOUNTER — Encounter: Payer: Self-pay | Admitting: Certified Nurse Midwife

## 2018-11-14 DIAGNOSIS — K635 Polyp of colon: Secondary | ICD-10-CM | POA: Diagnosis not present

## 2018-11-14 DIAGNOSIS — D128 Benign neoplasm of rectum: Secondary | ICD-10-CM | POA: Diagnosis not present

## 2018-11-14 DIAGNOSIS — K5730 Diverticulosis of large intestine without perforation or abscess without bleeding: Secondary | ICD-10-CM | POA: Diagnosis not present

## 2018-11-14 DIAGNOSIS — Z1211 Encounter for screening for malignant neoplasm of colon: Secondary | ICD-10-CM | POA: Diagnosis not present

## 2018-11-14 DIAGNOSIS — K621 Rectal polyp: Secondary | ICD-10-CM | POA: Diagnosis not present

## 2019-04-07 ENCOUNTER — Other Ambulatory Visit: Payer: Self-pay | Admitting: Certified Nurse Midwife

## 2019-04-07 ENCOUNTER — Other Ambulatory Visit: Payer: Self-pay | Admitting: Nurse Practitioner

## 2019-04-07 DIAGNOSIS — Z1231 Encounter for screening mammogram for malignant neoplasm of breast: Secondary | ICD-10-CM

## 2019-05-15 ENCOUNTER — Ambulatory Visit: Payer: BLUE CROSS/BLUE SHIELD

## 2019-05-15 DIAGNOSIS — H401131 Primary open-angle glaucoma, bilateral, mild stage: Secondary | ICD-10-CM | POA: Diagnosis not present

## 2019-05-25 ENCOUNTER — Ambulatory Visit: Payer: BLUE CROSS/BLUE SHIELD

## 2019-06-17 DIAGNOSIS — R739 Hyperglycemia, unspecified: Secondary | ICD-10-CM | POA: Diagnosis not present

## 2019-06-17 DIAGNOSIS — E7800 Pure hypercholesterolemia, unspecified: Secondary | ICD-10-CM | POA: Diagnosis not present

## 2019-06-17 DIAGNOSIS — I10 Essential (primary) hypertension: Secondary | ICD-10-CM | POA: Diagnosis not present

## 2019-07-21 DIAGNOSIS — H401131 Primary open-angle glaucoma, bilateral, mild stage: Secondary | ICD-10-CM | POA: Diagnosis not present

## 2019-08-07 ENCOUNTER — Ambulatory Visit: Payer: BC Managed Care – PPO

## 2019-08-14 ENCOUNTER — Ambulatory Visit: Payer: BC Managed Care – PPO

## 2019-08-14 ENCOUNTER — Other Ambulatory Visit: Payer: Self-pay

## 2019-08-14 ENCOUNTER — Ambulatory Visit
Admission: RE | Admit: 2019-08-14 | Discharge: 2019-08-14 | Disposition: A | Discharge status: Home / Self Care | Payer: BC Managed Care – PPO | Source: Ambulatory Visit | Attending: Certified Nurse Midwife | Admitting: Certified Nurse Midwife

## 2019-08-14 DIAGNOSIS — E7800 Pure hypercholesterolemia, unspecified: Secondary | ICD-10-CM | POA: Diagnosis not present

## 2019-08-14 DIAGNOSIS — Z1231 Encounter for screening mammogram for malignant neoplasm of breast: Secondary | ICD-10-CM | POA: Diagnosis not present

## 2019-08-14 DIAGNOSIS — I10 Essential (primary) hypertension: Secondary | ICD-10-CM | POA: Diagnosis not present

## 2019-08-14 DIAGNOSIS — R739 Hyperglycemia, unspecified: Secondary | ICD-10-CM | POA: Diagnosis not present

## 2019-09-04 ENCOUNTER — Other Ambulatory Visit: Payer: Self-pay

## 2019-09-04 ENCOUNTER — Encounter: Payer: Self-pay | Admitting: Certified Nurse Midwife

## 2019-09-04 ENCOUNTER — Ambulatory Visit (INDEPENDENT_AMBULATORY_CARE_PROVIDER_SITE_OTHER): Payer: BC Managed Care – PPO | Admitting: Certified Nurse Midwife

## 2019-09-04 ENCOUNTER — Other Ambulatory Visit (HOSPITAL_COMMUNITY)
Admission: RE | Admit: 2019-09-04 | Discharge: 2019-09-04 | Disposition: A | Discharge status: Home / Self Care | Payer: BC Managed Care – PPO | Source: Ambulatory Visit | Attending: Certified Nurse Midwife | Admitting: Certified Nurse Midwife

## 2019-09-04 VITALS — BP 118/80 | HR 64 | Temp 97.2°F | Resp 16 | Ht 61.75 in | Wt 173.0 lb

## 2019-09-04 DIAGNOSIS — Z8679 Personal history of other diseases of the circulatory system: Secondary | ICD-10-CM

## 2019-09-04 DIAGNOSIS — Z8719 Personal history of other diseases of the digestive system: Secondary | ICD-10-CM | POA: Diagnosis not present

## 2019-09-04 DIAGNOSIS — Z124 Encounter for screening for malignant neoplasm of cervix: Secondary | ICD-10-CM

## 2019-09-04 DIAGNOSIS — Z01419 Encounter for gynecological examination (general) (routine) without abnormal findings: Secondary | ICD-10-CM | POA: Diagnosis not present

## 2019-09-04 NOTE — Patient Instructions (Signed)

## 2019-09-04 NOTE — Progress Notes (Signed)
53 y.o. G1P0010 Single African American Fe here for annual exam. Periods none since 09/19/2018. Noted some bleeding on tissue 4 days ago very slight amount. Had yeast infection and treating with vinegar bath and yogurt. Also has hemorrhoids that she has noted some blood from rectum not vagina. Aware to advise if vaginal bleeding. Some hot flashes or night sweats. Sees PA Jarrett Soho for hypertension and labs. All stable per patient. No other health today.  Patient's last menstrual period was 08/19/2018 (exact date).          Sexually active: No.  The current method of family planning is peri menopausal.    Exercising: Yes.    some Smoker:  no  Review of Systems  Constitutional: Negative.   HENT: Negative.   Eyes: Negative.   Respiratory: Negative.   Cardiovascular: Negative.   Gastrointestinal: Negative.   Genitourinary: Negative.   Musculoskeletal: Negative.   Skin: Negative.   Neurological: Negative.   Endo/Heme/Allergies: Negative.   Psychiatric/Behavioral: Negative.     Health Maintenance: Pap:  02-04-18 neg HPV HR neg History of Abnormal Pap: no MMG:  08-14-2019 category c density birads 1:neg Self Breast exams: yes Colonoscopy:  2019, polyp f/u 22yrs BMD:   none TDaP:  2013 Shingles: no Pneumonia: no Hep C and HIV: HIV neg 2016 Labs: pcp    reports that she has never smoked. She has never used smokeless tobacco. She reports current alcohol use of about 5.0 standard drinks of alcohol per week. She reports that she does not use drugs.  Past Medical History:  Diagnosis Date  . Bell's palsy 2003   left cheek  . Glaucoma   . Hypertension   . STD (sexually transmitted disease) 1993   hx of syphillis  treated    Past Surgical History:  Procedure Laterality Date  . DILATION AND CURETTAGE OF UTERUS    . hysteroscopic resection  01/14/06   Removal of polyp and D&C    Current Outpatient Medications  Medication Sig Dispense Refill  . amLODipine (NORVASC) 10 MG  tablet Take 10 mg by mouth daily.  1  . Cholecalciferol (VITAMIN D PO) Take by mouth.    Marland Kitchen GARLIC PO Take by mouth.    . latanoprost (XALATAN) 0.005 % ophthalmic solution     . Omega-3 Fatty Acids (FISH OIL PO) Take by mouth.     No current facility-administered medications for this visit.     Family History  Problem Relation Age of Onset  . Hypertension Mother   . Glaucoma Mother   . Hypertension Father   . Glaucoma Maternal Grandmother   . Breast cancer Maternal Aunt     ROS:  Pertinent items are noted in HPI.  Otherwise, a comprehensive ROS was negative.  Exam:   BP 118/80   Pulse 64   Temp (!) 97.2 F (36.2 C) (Skin)   Resp 16   Ht 5' 1.75" (1.568 m)   Wt 173 lb (78.5 kg)   LMP 08/19/2018 (Exact Date) Comment: some bleeding monday  BMI 31.90 kg/m  Height: 5' 1.75" (156.8 cm) Ht Readings from Last 3 Encounters:  09/04/19 5' 1.75" (1.568 m)  09/02/18 5' 1.75" (1.568 m)  04/09/18 5' 1.75" (1.568 m)    General appearance: alert, cooperative and appears stated age Head: Normocephalic, without obvious abnormality, atraumatic Neck: no adenopathy, supple, symmetrical, trachea midline and thyroid normal to inspection and palpation Lungs: clear to auscultation bilaterally Breasts: normal appearance, no masses or tenderness, No nipple retraction or  dimpling, No nipple discharge or bleeding, No axillary or supraclavicular adenopathy Heart: regular rate and rhythm Abdomen: soft, non-tender; no masses,  no organomegaly Extremities: extremities normal, atraumatic, no cyanosis or edema Skin: Skin color, texture, turgor normal. No rashes or lesions Lymph nodes: Cervical, supraclavicular, and axillary nodes normal. No abnormal inguinal nodes palpated Neurologic: Grossly normal   Pelvic: External genitalia:  no lesions              Urethra:  normal appearing urethra with no masses, tenderness or lesions              Bartholin's and Skene's: normal                 Vagina: normal  appearing vagina with normal color and discharge, no lesions, no blood noted              Cervix: no cervical motion tenderness and no lesions , normal appearance, no blood noted from cervix            Pap taken: Yes.   Bimanual Exam:  Uterus:  normal size, contour, position, consistency, mobility, non-tender and anteverted              Adnexa: normal adnexa and no mass, fullness, tenderness               Rectovaginal: Confirms               Anus:  normal sphincter tone, no lesions  Chaperone present: yes  A:  Well Woman with normal exam  Menopausal no HRT   History of hemorrhoids with recent blood noted from rectum, not vagina  Hypertension stable per patient with PCP management  P:   Reviewed health and wellness pertinent to exam  Aware if vaginal bleeding and menopausal to advise and come in.  Discussed comfort measures for hemorrhoids, she treats with diet and is aware of OTC products. If black tarry stools or blood in stool needs to advise. Had colonoscopy last year with polyp.  Continue follow up with PCP as indicated.  Pap smear: yes  counseled on breast self exam, mammography screening, feminine hygiene, adequate intake of calcium and vitamin D, diet and exercise  return annually or prn  An After Visit Summary was printed and given to the patient.

## 2019-09-07 LAB — CYTOLOGY - PAP: Diagnosis: NEGATIVE

## 2020-02-10 ENCOUNTER — Encounter: Payer: Self-pay | Admitting: Certified Nurse Midwife

## 2020-02-22 ENCOUNTER — Telehealth: Payer: Self-pay | Admitting: Obstetrics and Gynecology

## 2020-02-22 NOTE — Telephone Encounter (Signed)
Patient is spotting after yeast infection.

## 2020-02-22 NOTE — Telephone Encounter (Signed)
Spoke with pt. Pt reports having intermittent vaginal bleeding that started on 02/17/2020 after having yeast infection the week prior. Pt only wearing panty liner, but no blood seen, only when wiping.  Pt states treating yeast infection at home with vinegar bath. Pt reports no itching since using vinegar bath. Pt denies pain with bleeding. Advised pt to be seen with Dr Oscar La. Pt agreeable. OV scheduled for 02/23/2020 at 3:30 pm with Dr Oscar La. Pt verbalized understanding. CPS Neg. Last AEX 09/04/2019 with DL. Pt not on HRT.   Routing to Dr Oscar La for review.  Encounter closed.

## 2020-02-23 ENCOUNTER — Encounter: Payer: Self-pay | Admitting: Obstetrics and Gynecology

## 2020-02-23 ENCOUNTER — Other Ambulatory Visit (HOSPITAL_COMMUNITY)
Admission: RE | Admit: 2020-02-23 | Discharge: 2020-02-23 | Disposition: A | Discharge status: Home / Self Care | Payer: BC Managed Care – PPO | Source: Ambulatory Visit | Attending: Obstetrics and Gynecology | Admitting: Obstetrics and Gynecology

## 2020-02-23 ENCOUNTER — Ambulatory Visit (INDEPENDENT_AMBULATORY_CARE_PROVIDER_SITE_OTHER): Payer: BC Managed Care – PPO | Admitting: Obstetrics and Gynecology

## 2020-02-23 ENCOUNTER — Other Ambulatory Visit: Payer: Self-pay

## 2020-02-23 VITALS — BP 122/68 | HR 90 | Temp 97.9°F | Ht 62.5 in | Wt 172.0 lb

## 2020-02-23 DIAGNOSIS — N882 Stricture and stenosis of cervix uteri: Secondary | ICD-10-CM | POA: Diagnosis not present

## 2020-02-23 DIAGNOSIS — N950 Postmenopausal bleeding: Secondary | ICD-10-CM

## 2020-02-23 DIAGNOSIS — N840 Polyp of corpus uteri: Secondary | ICD-10-CM | POA: Diagnosis not present

## 2020-02-23 NOTE — Patient Instructions (Signed)

## 2020-02-23 NOTE — Progress Notes (Signed)
GYNECOLOGY  VISIT   HPI: 54 y.o.   Single Black or African American Not Hispanic or Latino  female   G1P0010 with No LMP recorded. Patient is perimenopausal.   here for patient says that she noticed on 3/31 she had some spotting when she wiped.    She had an endometrial biopsy in 3/19 for perimenopausal bleeding. It returned with disordered endometrium  She went from 11/19-11/20 without bleeding.   She self treated for a yeast infection 2 weeks ago. In the last week she has been spotting, see's it when she wipes. No pain.  She was having vasomotor symptoms previously, they are better currently.  No intercourse in over 2 years.   09/04/19: negative pap. 02/04/18 FSH: 34  02/04/18 Endometrial biopsy: disordered proliferative endometrium    GYNECOLOGIC HISTORY: No LMP recorded. Patient is perimenopausal. Contraception:none  Menopausal hormone therapy: none         OB History    Gravida  1   Para  0   Term  0   Preterm  0   AB  1   Living  0     SAB  1   TAB  0   Ectopic  0   Multiple  0   Live Births                 Patient Active Problem List   Diagnosis Date Noted  . Hypertension 09/02/2018    Class: History of    Past Medical History:  Diagnosis Date  . Bell's palsy 2003   left cheek  . Glaucoma   . Hypertension   . STD (sexually transmitted disease) 1993   hx of syphillis  treated    Past Surgical History:  Procedure Laterality Date  . DILATION AND CURETTAGE OF UTERUS    . hysteroscopic resection  01/14/06   Removal of polyp and D&C    Current Outpatient Medications  Medication Sig Dispense Refill  . amLODipine (NORVASC) 10 MG tablet Take 10 mg by mouth daily.  1  . ASPIRIN 81 PO Take by mouth as needed.    . Cholecalciferol (VITAMIN D PO) Take by mouth.    . dorzolamide-timolol (COSOPT) 22.3-6.8 MG/ML ophthalmic solution INSTILL 1 DROP INTO BOTH EYES TWICE A DAY    . GARLIC PO Take by mouth.    . latanoprost (XALATAN) 0.005 %  ophthalmic solution     . Omega-3 Fatty Acids (FISH OIL PO) Take by mouth.     No current facility-administered medications for this visit.     ALLERGIES: Amoxicillin  Family History  Problem Relation Age of Onset  . Hypertension Mother   . Glaucoma Mother   . Hypertension Father   . Glaucoma Maternal Grandmother   . Breast cancer Maternal Aunt     Social History   Socioeconomic History  . Marital status: Single    Spouse name: Not on file  . Number of children: Not on file  . Years of education: Not on file  . Highest education level: Not on file  Occupational History  . Not on file  Tobacco Use  . Smoking status: Never Smoker  . Smokeless tobacco: Never Used  Substance and Sexual Activity  . Alcohol use: Yes    Alcohol/week: 5.0 standard drinks    Types: 5 Standard drinks or equivalent per week  . Drug use: No  . Sexual activity: Not Currently    Partners: Male    Birth control/protection: None  Comment: vibrator  Other Topics Concern  . Not on file  Social History Narrative  . Not on file   Social Determinants of Health   Financial Resource Strain:   . Difficulty of Paying Living Expenses:   Food Insecurity:   . Worried About Programme researcher, broadcasting/film/video in the Last Year:   . Barista in the Last Year:   Transportation Needs:   . Freight forwarder (Medical):   Marland Kitchen Lack of Transportation (Non-Medical):   Physical Activity:   . Days of Exercise per Week:   . Minutes of Exercise per Session:   Stress:   . Feeling of Stress :   Social Connections:   . Frequency of Communication with Friends and Family:   . Frequency of Social Gatherings with Friends and Family:   . Attends Religious Services:   . Active Member of Clubs or Organizations:   . Attends Banker Meetings:   Marland Kitchen Marital Status:   Intimate Partner Violence:   . Fear of Current or Ex-Partner:   . Emotionally Abused:   Marland Kitchen Physically Abused:   . Sexually Abused:     Review of  Systems  Constitutional: Negative.   HENT: Positive for sinus pain.   Eyes: Negative.   Respiratory: Negative.   Cardiovascular: Negative.   Gastrointestinal: Negative.   Genitourinary: Negative.   Musculoskeletal: Negative.   Skin: Negative.   Neurological: Negative.   Endo/Heme/Allergies: Positive for environmental allergies.  Psychiatric/Behavioral: Negative.     PHYSICAL EXAMINATION:    BP 122/68   Pulse 90   Temp 97.9 F (36.6 C)   Ht 5' 2.5" (1.588 m)   Wt 172 lb (78 kg)   SpO2 98%   BMI 30.96 kg/m     General appearance: alert, cooperative and appears stated age Neck: no adenopathy, supple, symmetrical, trachea midline and thyroid normal to inspection and palpation Abdomen: soft, non-tender; non distended, no masses,  no organomegaly  Pelvic: External genitalia:  no lesions              Urethra:  normal appearing urethra with no masses, tenderness or lesions              Bartholins and Skenes: normal                 Vagina: normal appearing vagina with normal color and discharge, no lesions              Cervix: no lesions and stenotis              Bimanual Exam:  Uterus:  normal size, contour, position, consistency, mobility, non-tender              Adnexa: no mass, fullness, tenderness                The risks of endometrial biopsy were reviewed and a consent was obtained.  A speculum was placed in the vagina and the cervix was cleansed with betadine. A tenaculum was placed on the cervix and the pipelle could not pass the cervix. The cervix was dilated with the mini-dilators. The mini-pipelle was placed into the endometrial cavity. The uterus sounded to 8 cm. The endometrial biopsy was performed, taking care to get a representative sample, sampling 360 degrees of the uterine cavity. Minimal tissue was obtained. The procedure was repeated with the regular pipelle, again getting a 360 degree sample. The cavity felt gritty at the end of the procedure. A moderate  amount of  tissue was obtained. The tenaculum and speculum were removed. There were no complications.    Chaperone was present for exam.  ASSESSMENT Postmenopausal bleeding.     PLAN Pap up to date and normal  Reviewed last pap, last biopsy, last [facility] Endometrial biopsy done Depending on results, will likely schedule ultrasound or sonohysterogram   An After Visit Summary was printed and given to the patient.

## 2020-02-24 DIAGNOSIS — H401131 Primary open-angle glaucoma, bilateral, mild stage: Secondary | ICD-10-CM | POA: Diagnosis not present

## 2020-02-25 LAB — SURGICAL PATHOLOGY

## 2020-02-26 ENCOUNTER — Telehealth: Payer: Self-pay | Admitting: Obstetrics and Gynecology

## 2020-02-26 DIAGNOSIS — N950 Postmenopausal bleeding: Secondary | ICD-10-CM

## 2020-02-26 DIAGNOSIS — N840 Polyp of corpus uteri: Secondary | ICD-10-CM

## 2020-02-26 NOTE — Telephone Encounter (Signed)
Return call to patient. Left message to call back to triage nurse.    

## 2020-02-26 NOTE — Telephone Encounter (Signed)
Patient returning call to go over results.  

## 2020-02-26 NOTE — Telephone Encounter (Signed)
Patient returned call

## 2020-02-26 NOTE — Telephone Encounter (Signed)
-----   Message from Romualdo Bolk, MD sent at 02/25/2020  4:10 PM EDT ----- Please inform the patient that a fragment of an endometrial polyp was noted on her biopsy, otherwise inactive endometrium. Options are to return for a sonohysterogram to confirm that there is still residual polyp, or proceed with hysteroscopy, D&C in the OR (with anesthesia)

## 2020-02-26 NOTE — Telephone Encounter (Signed)
Spoke with patient. Advised per Dr. Oscar La.   Patient asking if there is still a need for surgery if no residual polyp present? This will help her decide how she would proceed.   Advised patient I will forward to Dr. Oscar La to review when she returns on 4/12 and f/u with recommendations, patient agreeable.   Routing to Dr. Oscar La to advise.

## 2020-02-28 NOTE — Telephone Encounter (Signed)
That is the reason to do the sonohysterogram, if there is no polyp seen no further treatment is needed.

## 2020-02-29 NOTE — Telephone Encounter (Signed)
Spoke with patient, advised per Dr. Oscar La. Patient request to proceed with [facility]. [facility] scheduled for 4/20 at 2:30pm, consult to follow with Dr. Oscar La. Order placed for precert. Patient verbalizes understanding and is agreeable.   Routing to provider for final review. Patient is agreeable to disposition. Will close encounter.  Cc: Webb Silversmith, [place]

## 2020-03-07 ENCOUNTER — Other Ambulatory Visit: Payer: Self-pay

## 2020-03-08 ENCOUNTER — Ambulatory Visit (INDEPENDENT_AMBULATORY_CARE_PROVIDER_SITE_OTHER): Payer: BC Managed Care – PPO | Admitting: Obstetrics and Gynecology

## 2020-03-08 ENCOUNTER — Other Ambulatory Visit: Payer: Self-pay | Admitting: Obstetrics and Gynecology

## 2020-03-08 ENCOUNTER — Encounter: Payer: Self-pay | Admitting: Obstetrics and Gynecology

## 2020-03-08 ENCOUNTER — Ambulatory Visit (INDEPENDENT_AMBULATORY_CARE_PROVIDER_SITE_OTHER): Payer: BC Managed Care – PPO

## 2020-03-08 VITALS — BP 160/80 | HR 60 | Wt 172.0 lb

## 2020-03-08 DIAGNOSIS — N840 Polyp of corpus uteri: Secondary | ICD-10-CM

## 2020-03-08 DIAGNOSIS — N950 Postmenopausal bleeding: Secondary | ICD-10-CM

## 2020-03-08 DIAGNOSIS — N858 Other specified noninflammatory disorders of uterus: Secondary | ICD-10-CM

## 2020-03-08 NOTE — Progress Notes (Signed)
Patient was advised to follow up with PCP regarding elevated B/P in office today per Dr.Jertson.

## 2020-03-08 NOTE — Progress Notes (Signed)
GYNECOLOGY  VISIT   HPI: 54 y.o.   Single Black or African American Not Hispanic or Latino  female   G1P0010 with No LMP recorded. Patient is perimenopausal.   here for evaluation of PMP bleeding. She was originally seen earlier this month c/o spotting. An endometrial biopsy was done and returned with fragment of benign endometrial polyp and atrophic endometrium. She was offered the choice of further evaluation with ultrasound/sonohysterogram vs proceeding with hysteroscopy. She prefers to start with ultrasond. She hasn't had any further bleeding. Prior to her last visit she spotted x 7 days.  09/04/19: negative pap. 02/04/18 FSH: 34  02/04/18 Endometrial biopsy: disordered proliferative endometrium   GYNECOLOGIC HISTORY: No LMP recorded. Patient is perimenopausal. Contraception:none, PMP Menopausal hormone therapy: none        OB History    Gravida  1   Para  0   Term  0   Preterm  0   AB  1   Living  0     SAB  1   TAB  0   Ectopic  0   Multiple  0   Live Births                 Patient Active Problem List   Diagnosis Date Noted  . Hypertension 09/02/2018    Class: History of    Past Medical History:  Diagnosis Date  . Bell's palsy 2003   left cheek  . Glaucoma   . Hypertension   . STD (sexually transmitted disease) 1993   hx of syphillis  treated    Past Surgical History:  Procedure Laterality Date  . DILATION AND CURETTAGE OF UTERUS    . hysteroscopic resection  01/14/06   Removal of polyp and D&C    Current Outpatient Medications  Medication Sig Dispense Refill  . amLODipine (NORVASC) 10 MG tablet Take 10 mg by mouth daily.  1  . ASPIRIN 81 PO Take by mouth as needed.    . Cholecalciferol (VITAMIN D PO) Take by mouth.    . dorzolamide-timolol (COSOPT) 22.3-6.8 MG/ML ophthalmic solution INSTILL 1 DROP INTO BOTH EYES TWICE A DAY    . GARLIC PO Take by mouth.    . latanoprost (XALATAN) 0.005 % ophthalmic solution     . Omega-3 Fatty Acids  (FISH OIL PO) Take by mouth.     No current facility-administered medications for this visit.     ALLERGIES: Amoxicillin  Family History  Problem Relation Age of Onset  . Hypertension Mother   . Glaucoma Mother   . Hypertension Father   . Glaucoma Maternal Grandmother   . Breast cancer Maternal Aunt     Social History   Socioeconomic History  . Marital status: Single    Spouse name: Not on file  . Number of children: Not on file  . Years of education: Not on file  . Highest education level: Not on file  Occupational History  . Not on file  Tobacco Use  . Smoking status: Never Smoker  . Smokeless tobacco: Never Used  Substance and Sexual Activity  . Alcohol use: Yes    Alcohol/week: 5.0 standard drinks    Types: 5 Standard drinks or equivalent per week  . Drug use: No  . Sexual activity: Not Currently    Partners: Male    Birth control/protection: None    Comment: vibrator  Other Topics Concern  . Not on file  Social History Narrative  . Not on file  Social Determinants of Health   Financial Resource Strain:   . Difficulty of Paying Living Expenses:   Food Insecurity:   . Worried About Charity fundraiser in the Last Year:   . Arboriculturist in the Last Year:   Transportation Needs:   . Film/video editor (Medical):   Marland Kitchen Lack of Transportation (Non-Medical):   Physical Activity:   . Days of Exercise per Week:   . Minutes of Exercise per Session:   Stress:   . Feeling of Stress :   Social Connections:   . Frequency of Communication with Friends and Family:   . Frequency of Social Gatherings with Friends and Family:   . Attends Religious Services:   . Active Member of Clubs or Organizations:   . Attends Archivist Meetings:   Marland Kitchen Marital Status:   Intimate Partner Violence:   . Fear of Current or Ex-Partner:   . Emotionally Abused:   Marland Kitchen Physically Abused:   . Sexually Abused:     ROS  PHYSICAL EXAMINATION:    BP (!) 160/80 (Cuff  Size: Large) Comment: pt.did take b/p med this AM  Pulse 60   Wt 172 lb (78 kg)   BMI 30.96 kg/m     General appearance: alert, cooperative and appears stated age  Ultrasound images reviewed with the patient. Her endometrial stripe is thin and uniform, measuring between 2.25 an 2.57 cm. No blood flow in the endometrium, no signs of an endometrial polyp. Few small myomas, normal ovaries bilaterally.    ASSESSMENT PMP spotting, endometrial biopsy with fragment of benign endometrial polyp and atrophic endometrium. Ultrasound reassuring, no signs of a polyp H/O HTN, elevated BP. She will f/u with her primary.     PLAN Discussed the option of proceeding with the sonohysterogram or only going forward if she has further bleeding. Discussed the small chance of missing a polyp with her current ultrasound findings. We discussed that if she does have a polyp it would need to be removed. She would like to defer further evaluation unless she has further bleeding. Call with any bleeding or concerns.

## 2020-07-12 ENCOUNTER — Other Ambulatory Visit: Payer: Self-pay | Admitting: Obstetrics and Gynecology

## 2020-07-12 ENCOUNTER — Other Ambulatory Visit: Payer: Self-pay | Admitting: Certified Nurse Midwife

## 2020-07-12 DIAGNOSIS — Z1231 Encounter for screening mammogram for malignant neoplasm of breast: Secondary | ICD-10-CM

## 2020-08-15 ENCOUNTER — Other Ambulatory Visit: Payer: Self-pay

## 2020-08-15 ENCOUNTER — Ambulatory Visit
Admission: RE | Admit: 2020-08-15 | Discharge: 2020-08-15 | Disposition: A | Discharge status: Home / Self Care | Payer: BC Managed Care – PPO | Source: Ambulatory Visit | Attending: Obstetrics and Gynecology | Admitting: Obstetrics and Gynecology

## 2020-08-15 DIAGNOSIS — Z1231 Encounter for screening mammogram for malignant neoplasm of breast: Secondary | ICD-10-CM | POA: Diagnosis not present

## 2020-09-23 DIAGNOSIS — H401131 Primary open-angle glaucoma, bilateral, mild stage: Secondary | ICD-10-CM | POA: Diagnosis not present

## 2020-09-30 ENCOUNTER — Ambulatory Visit: Payer: BC Managed Care – PPO | Admitting: Obstetrics and Gynecology

## 2020-10-07 DIAGNOSIS — H401131 Primary open-angle glaucoma, bilateral, mild stage: Secondary | ICD-10-CM | POA: Diagnosis not present

## 2020-11-02 DIAGNOSIS — H401131 Primary open-angle glaucoma, bilateral, mild stage: Secondary | ICD-10-CM | POA: Diagnosis not present

## 2020-11-16 ENCOUNTER — Ambulatory Visit: Payer: BC Managed Care – PPO | Admitting: Obstetrics and Gynecology

## 2021-01-04 DIAGNOSIS — H401131 Primary open-angle glaucoma, bilateral, mild stage: Secondary | ICD-10-CM | POA: Diagnosis not present

## 2021-01-09 DIAGNOSIS — I10 Essential (primary) hypertension: Secondary | ICD-10-CM | POA: Diagnosis not present

## 2021-01-09 DIAGNOSIS — E7800 Pure hypercholesterolemia, unspecified: Secondary | ICD-10-CM | POA: Diagnosis not present

## 2021-01-09 DIAGNOSIS — Z20822 Contact with and (suspected) exposure to covid-19: Secondary | ICD-10-CM | POA: Diagnosis not present

## 2021-01-09 DIAGNOSIS — Z0000 Encounter for general adult medical examination without abnormal findings: Secondary | ICD-10-CM | POA: Diagnosis not present

## 2021-01-17 NOTE — Progress Notes (Signed)
55 y.o. G1P0010 Single Black or [place] American female here for annual exam.    Doing well, no post menopausal bleeding Walking for exercise, feels good Works as Museum/gallery conservator, has been with same company x 16 years  Patient's last menstrual period was 08/19/2018 (exact date).          Sexually active: No.  The current method of family planning is post menopausal status.    Exercising: Yes.    walking Smoker:  no  Health Maintenance: Pap:  02-04-18 neg HPV HR neg, 09-04-2019 neg History of abnormal Pap:  no MMG:  08-15-2020 category c density birads 1:neg Colonoscopy:  2019 polyp f/u 28yrs BMD:   none TDaP:  2015 Gardasil:   n/a Covid-19: not done Hep C testing: not done Screening Labs: with PCP   reports that she has never smoked. She has never used smokeless tobacco. She reports current alcohol use of about 5.0 standard drinks of alcohol per week. She reports that she does not use drugs.  Past Medical History:  Diagnosis Date  . Bell's palsy 2003   left cheek  . Glaucoma   . Hypertension   . STD (sexually transmitted disease) 1993   hx of syphillis  treated    Past Surgical History:  Procedure Laterality Date  . DILATION AND CURETTAGE OF UTERUS    . hysteroscopic resection  01/14/06   Removal of polyp and D&C    Current Outpatient Medications  Medication Sig Dispense Refill  . amLODipine (NORVASC) 10 MG tablet Take 10 mg by mouth daily.  1  . ASPIRIN 81 PO Take by mouth as needed.    . brimonidine (ALPHAGAN) 0.2 % ophthalmic solution SMARTSIG:2 Drop(s) In Eye(s) Every 12 Hours    . Cholecalciferol (VITAMIN D PO) Take by mouth.    . dorzolamide-timolol (COSOPT) 22.3-6.8 MG/ML ophthalmic solution INSTILL 1 DROP INTO BOTH EYES TWICE A DAY    . GARLIC PO Take by mouth.    . latanoprost (XALATAN) 0.005 % ophthalmic solution     . Multiple Vitamins-Minerals (ZINC PO) Take by mouth.    . Omega-3 Fatty Acids (FISH OIL PO) Take by mouth.    . Travoprost, BAK Free,  (TRAVATAN) 0.004 % SOLN ophthalmic solution SMARTSIG:In Eye(s)     No current facility-administered medications for this visit.    Family History  Problem Relation Age of Onset  . Hypertension Mother   . Glaucoma Mother   . Hypertension Father   . Glaucoma Maternal Grandmother   . Breast cancer Maternal Aunt     Review of Systems  Constitutional: Negative.   HENT: Negative.   Eyes: Negative.   Respiratory: Negative.   Cardiovascular: Negative.   Gastrointestinal: Negative.   Endocrine: Negative.   Genitourinary: Negative.   Musculoskeletal: Negative.   Skin: Negative.   Allergic/Immunologic: Negative.   Neurological: Negative.   Hematological: Negative.   Psychiatric/Behavioral: Negative.     Exam:   BP 116/72   Pulse 68   Resp 16   Ht 5\' 2"  (1.575 m)   Wt 171 lb (77.6 kg)   LMP 08/19/2018 (Exact Date) Comment: some bleeding monday  BMI 31.28 kg/m   Height: 5\' 2"  (157.5 cm)  General appearance: alert, cooperative and appears stated age, no acute distress Head: Normocephalic, without obvious abnormality Neck: no adenopathy, thyroid normal to inspection and palpation Lungs: clear to auscultation bilaterally Breasts: No axillary or supraclavicular adenopathy, Normal to palpation without dominant masses Heart: regular rate and rhythm Abdomen:  soft, non-tender; no masses,  no organomegaly Extremities: extremities normal, no edema Skin: No rashes or lesions Lymph nodes: Cervical, supraclavicular, and axillary nodes normal. No abnormal inguinal nodes palpated Neurologic: Grossly normal   Pelvic: External genitalia:  no lesions              Urethra:  normal appearing urethra with no masses, tenderness or lesions              Bartholins and Skenes: normal                 Vagina: normal appearing vagina, appropriate for age, normal appearing discharge, no lesions              Cervix: neg cervical motion tenderness, no visible lesions             Bimanual Exam:    Uterus:  normal size, contour, position, consistency, mobility, non-tender              Adnexa: no mass, fullness, tenderness                 Joy, CMA Chaperone was present for exam.  A:  Well Woman with normal exam  P:   Pap : cotesting due 2023  Mammogram: next due 07/2021  Labs: with PCP  Medications: No new

## 2021-01-19 ENCOUNTER — Ambulatory Visit: Payer: BC Managed Care – PPO | Admitting: Obstetrics and Gynecology

## 2021-01-19 ENCOUNTER — Ambulatory Visit (INDEPENDENT_AMBULATORY_CARE_PROVIDER_SITE_OTHER): Payer: BC Managed Care – PPO | Admitting: Nurse Practitioner

## 2021-01-19 ENCOUNTER — Encounter: Payer: Self-pay | Admitting: Nurse Practitioner

## 2021-01-19 ENCOUNTER — Other Ambulatory Visit: Payer: Self-pay

## 2021-01-19 VITALS — BP 116/72 | HR 68 | Resp 16 | Ht 62.0 in | Wt 171.0 lb

## 2021-01-19 DIAGNOSIS — Z01419 Encounter for gynecological examination (general) (routine) without abnormal findings: Secondary | ICD-10-CM | POA: Diagnosis not present

## 2021-01-19 NOTE — Patient Instructions (Signed)
Pap : cotesting due 2023 Mammogram: next due 07/2021   Health Maintenance for Postmenopausal Women Menopause is a normal process in which your ability to get pregnant comes to an end. This process happens slowly over many months or years, usually between the ages of 70 and 58. Menopause is complete when you have missed your menstrual periods for 12 months. It is important to talk with your health care provider about some of the most common conditions that affect women after menopause (postmenopausal women). These include heart disease, cancer, and bone loss (osteoporosis). Adopting a healthy lifestyle and getting preventive care can help to promote your health and wellness. The actions you take can also lower your chances of developing some of these common conditions. What should I know about menopause? During menopause, you may get a number of symptoms, such as:  Hot flashes. These can be moderate or severe.  Night sweats.  Decrease in sex drive.  Mood swings.  Headaches.  Tiredness.  Irritability.  Memory problems.  Insomnia. Choosing to treat or not to treat these symptoms is a decision that you make with your health care provider. Do I need hormone replacement therapy?  Hormone replacement therapy is effective in treating symptoms that are caused by menopause, such as hot flashes and night sweats.  Hormone replacement carries certain risks, especially as you become older. If you are thinking about using estrogen or estrogen with progestin, discuss the benefits and risks with your health care provider. What is my risk for heart disease and stroke? The risk of heart disease, heart attack, and stroke increases as you age. One of the causes may be a change in the body's hormones during menopause. This can affect how your body uses dietary fats, triglycerides, and cholesterol. Heart attack and stroke are medical emergencies. There are many things that you can do to help prevent heart  disease and stroke. Watch your blood pressure  High blood pressure causes heart disease and increases the risk of stroke. This is more likely to develop in people who have high blood pressure readings, are of African descent, or are overweight.  Have your blood pressure checked: ? Every 3-5 years if you are 33-41 years of age. ? Every year if you are 71 years old or older. Eat a healthy diet  Eat a diet that includes plenty of vegetables, fruits, low-fat dairy products, and lean protein.  Do not eat a lot of foods that are high in solid fats, added sugars, or sodium.   Get regular exercise Get regular exercise. This is one of the most important things you can do for your health. Most adults should:  Try to exercise for at least 150 minutes each week. The exercise should increase your heart rate and make you sweat (moderate-intensity exercise).  Try to do strengthening exercises at least twice each week. Do these in addition to the moderate-intensity exercise.  Spend less time sitting. Even light physical activity can be beneficial. Other tips  Work with your health care provider to achieve or maintain a healthy weight.  Do not use any products that contain nicotine or tobacco, such as cigarettes, e-cigarettes, and chewing tobacco. If you need help quitting, ask your health care provider.  Know your numbers. Ask your health care provider to check your cholesterol and your blood sugar (glucose). Continue to have your blood tested as directed by your health care provider. Do I need screening for cancer? Depending on your health history and family history, you may  need to have cancer screening at different stages of your life. This may include screening for:  Breast cancer.  Cervical cancer.  Lung cancer.  Colorectal cancer. What is my risk for osteoporosis? After menopause, you may be at increased risk for osteoporosis. Osteoporosis is a condition in which bone destruction happens  more quickly than new bone creation. To help prevent osteoporosis or the bone fractures that can happen because of osteoporosis, you may take the following actions:  If you are 40-34 years old, get at least 1,000 mg of calcium and at least 600 mg of vitamin D per day.  If you are older than age 49 but younger than age 41, get at least 1,200 mg of calcium and at least 600 mg of vitamin D per day.  If you are older than age 66, get at least 1,200 mg of calcium and at least 800 mg of vitamin D per day. Smoking and drinking excessive alcohol increase the risk of osteoporosis. Eat foods that are rich in calcium and vitamin D, and do weight-bearing exercises several times each week as directed by your health care provider. How does menopause affect my mental health? Depression may occur at any age, but it is more common as you become older. Common symptoms of depression include:  Low or sad mood.  Changes in sleep patterns.  Changes in appetite or eating patterns.  Feeling an overall lack of motivation or enjoyment of activities that you previously enjoyed.  Frequent crying spells. Talk with your health care provider if you think that you are experiencing depression. General instructions See your health care provider for regular wellness exams and vaccines. This may include:  Scheduling regular health, dental, and eye exams.  Getting and maintaining your vaccines. These include: ? Influenza vaccine. Get this vaccine each year before the flu season begins. ? Pneumonia vaccine. ? Shingles vaccine. ? Tetanus, diphtheria, and pertussis (Tdap) booster vaccine. Your health care provider may also recommend other immunizations. Tell your health care provider if you have ever been abused or do not feel safe at home. Summary  Menopause is a normal process in which your ability to get pregnant comes to an end.  This condition causes hot flashes, night sweats, decreased interest in sex, mood  swings, headaches, or lack of sleep.  Treatment for this condition may include hormone replacement therapy.  Take actions to keep yourself healthy, including exercising regularly, eating a healthy diet, watching your weight, and checking your blood pressure and blood sugar levels.  Get screened for cancer and depression. Make sure that you are up to date with all your vaccines. This information is not intended to replace advice given to you by your health care provider. Make sure you discuss any questions you have with your health care provider. Document Revised: 10/29/2018 Document Reviewed: 10/29/2018 Elsevier Patient Education  2021 [employer].

## 2021-02-01 DIAGNOSIS — H401131 Primary open-angle glaucoma, bilateral, mild stage: Secondary | ICD-10-CM | POA: Diagnosis not present

## 2021-04-10 DIAGNOSIS — H401131 Primary open-angle glaucoma, bilateral, mild stage: Secondary | ICD-10-CM | POA: Diagnosis not present

## 2021-05-17 DIAGNOSIS — H401131 Primary open-angle glaucoma, bilateral, mild stage: Secondary | ICD-10-CM | POA: Diagnosis not present

## 2021-07-17 ENCOUNTER — Other Ambulatory Visit: Payer: Self-pay | Admitting: Obstetrics and Gynecology

## 2021-07-17 DIAGNOSIS — Z1231 Encounter for screening mammogram for malignant neoplasm of breast: Secondary | ICD-10-CM

## 2021-08-18 ENCOUNTER — Ambulatory Visit: Payer: BC Managed Care – PPO

## 2021-09-13 ENCOUNTER — Ambulatory Visit: Payer: BC Managed Care – PPO

## 2021-09-18 DIAGNOSIS — H401131 Primary open-angle glaucoma, bilateral, mild stage: Secondary | ICD-10-CM | POA: Diagnosis not present

## 2021-10-02 DIAGNOSIS — H524 Presbyopia: Secondary | ICD-10-CM | POA: Diagnosis not present

## 2021-10-24 ENCOUNTER — Ambulatory Visit
Admission: RE | Admit: 2021-10-24 | Discharge: 2021-10-24 | Disposition: A | Discharge status: Home / Self Care | Payer: BC Managed Care – PPO | Source: Ambulatory Visit | Attending: Obstetrics and Gynecology | Admitting: Obstetrics and Gynecology

## 2021-10-24 DIAGNOSIS — Z1231 Encounter for screening mammogram for malignant neoplasm of breast: Secondary | ICD-10-CM

## 2021-10-26 ENCOUNTER — Ambulatory Visit: Payer: BC Managed Care – PPO

## 2022-01-15 DIAGNOSIS — Z0000 Encounter for general adult medical examination without abnormal findings: Secondary | ICD-10-CM | POA: Diagnosis not present

## 2022-01-15 DIAGNOSIS — I10 Essential (primary) hypertension: Secondary | ICD-10-CM | POA: Diagnosis not present

## 2022-01-15 DIAGNOSIS — E7800 Pure hypercholesterolemia, unspecified: Secondary | ICD-10-CM | POA: Diagnosis not present

## 2022-01-26 NOTE — Progress Notes (Unsigned)
56 y.o. G1P0010 Single Black or African American Not Hispanic or Latino female here for annual exam.      Patient's last menstrual period was 08/19/2018 (exact date).          Sexually active: {yes no:314532}  The current method of family planning is {contraception:315051}.    Exercising: {yes no:314532}  {types:19826} Smoker:  no  Health Maintenance: Pap:  09-04-19 normal History of abnormal Pap:  no MMG:  10-24-21 normal BMD:   Never Colonoscopy: 11-14-18 small sessile polyp TDaP:  2015 Gardasil: N/A   reports that she has never smoked. She has never used smokeless tobacco. She reports current alcohol use of about 5.0 standard drinks per week. She reports that she does not use drugs.  Past Medical History:  Diagnosis Date   Bell's palsy 2003   left cheek   Glaucoma    Hypertension    STD (sexually transmitted disease) 1993   hx of syphillis  treated    Past Surgical History:  Procedure Laterality Date   DILATION AND CURETTAGE OF UTERUS     hysteroscopic resection  01/14/06   Removal of polyp and D&C    Current Outpatient Medications  Medication Sig Dispense Refill   amLODipine (NORVASC) 10 MG tablet Take 10 mg by mouth daily.  1   ASPIRIN 81 PO Take by mouth as needed.     brimonidine (ALPHAGAN) 0.2 % ophthalmic solution SMARTSIG:2 Drop(s) In Eye(s) Every 12 Hours     Cholecalciferol (VITAMIN D PO) Take by mouth.     dorzolamide-timolol (COSOPT) 22.3-6.8 MG/ML ophthalmic solution INSTILL 1 DROP INTO BOTH EYES TWICE A DAY     GARLIC PO Take by mouth.     latanoprost (XALATAN) 0.005 % ophthalmic solution      Multiple Vitamins-Minerals (ZINC PO) Take by mouth.     Omega-3 Fatty Acids (FISH OIL PO) Take by mouth.     Travoprost, BAK Free, (TRAVATAN) 0.004 % SOLN ophthalmic solution SMARTSIG:In Eye(s)     No current facility-administered medications for this visit.    Family History  Problem Relation Age of Onset   Hypertension Mother    Glaucoma Mother     Hypertension Father    Glaucoma Maternal Grandmother    Breast cancer Maternal Aunt     Review of Systems  Exam:   LMP 08/19/2018 (Exact Date) Comment: some bleeding monday  Weight change: @WEIGHTCHANGE @ Height:      Ht Readings from Last 3 Encounters:  01/19/21 5\' 2"  (1.575 m)  02/23/20 5' 2.5" (1.588 m)  09/04/19 5' 1.75" (1.568 m)    General appearance: alert, cooperative and appears stated age Head: Normocephalic, without obvious abnormality, atraumatic Neck: no adenopathy, supple, symmetrical, trachea midline and thyroid {CHL AMB PHY EX THYROID NORM DEFAULT:[phone]::"normal to inspection and palpation"} Lungs: clear to auscultation bilaterally Cardiovascular: regular rate and rhythm Breasts: {Exam; breast:13139::"normal appearance, no masses or tenderness"} Abdomen: soft, non-tender; non distended,  no masses,  no organomegaly Extremities: extremities normal, atraumatic, no cyanosis or edema Skin: Skin color, texture, turgor normal. No rashes or lesions Lymph nodes: Cervical, supraclavicular, and axillary nodes normal. No abnormal inguinal nodes palpated Neurologic: Grossly normal   Pelvic: External genitalia:  no lesions              Urethra:  normal appearing urethra with no masses, tenderness or lesions              Bartholins and Skenes: normal  Vagina: normal appearing vagina with normal color and discharge, no lesions              Cervix: {CHL AMB PHY EX CERVIX NORM DEFAULT:[phone]::"no lesions"}               Bimanual Exam:  Uterus:  {CHL AMB PHY EX UTERUS NORM DEFAULT:[phone]::"normal size, contour, position, consistency, mobility, non-tender"}              Adnexa: {CHL AMB PHY EX ADNEXA NO MASS DEFAULT:[id number]::"no mass, fullness, tenderness"}               Rectovaginal: Confirms               Anus:  normal sphincter tone, no lesions  *** chaperoned for the exam.  A:  Well Woman with normal exam  P:

## 2022-02-09 ENCOUNTER — Encounter: Payer: Self-pay | Admitting: Obstetrics and Gynecology

## 2022-02-09 ENCOUNTER — Ambulatory Visit (INDEPENDENT_AMBULATORY_CARE_PROVIDER_SITE_OTHER): Payer: BC Managed Care – PPO | Admitting: Obstetrics and Gynecology

## 2022-02-09 ENCOUNTER — Other Ambulatory Visit: Payer: Self-pay

## 2022-02-09 VITALS — BP 150/86 | HR 84 | Ht 61.0 in | Wt 178.0 lb

## 2022-02-09 DIAGNOSIS — J309 Allergic rhinitis, unspecified: Secondary | ICD-10-CM | POA: Insufficient documentation

## 2022-02-09 DIAGNOSIS — E7800 Pure hypercholesterolemia, unspecified: Secondary | ICD-10-CM | POA: Insufficient documentation

## 2022-02-09 DIAGNOSIS — Z01419 Encounter for gynecological examination (general) (routine) without abnormal findings: Secondary | ICD-10-CM

## 2022-02-09 NOTE — Patient Instructions (Signed)

## 2022-04-04 ENCOUNTER — Telehealth: Payer: Self-pay

## 2022-04-04 ENCOUNTER — Telehealth: Payer: Self-pay | Admitting: *Deleted

## 2022-04-04 NOTE — Telephone Encounter (Signed)
Message from front desk that this patient stopped by the office. She said Dr. Larose Kells would like her to have bone density testing through Dr. Talbert Nan.  Ok to place order so that they can schedule her? ?

## 2022-04-04 NOTE — Telephone Encounter (Signed)
Patient called to reports she noticed a bump on her nipple x 2 weeks, no drainage, no pain. I told patient I recommend she schedule an office visit with provider. Message sent to appointments to schedule. ?

## 2022-04-09 NOTE — Telephone Encounter (Signed)
I called patient to discuss and started with the front desk told me you stopped by becaue Dr. Larose Kells wanted you to have bone density test with Dr. Talbert Nan to order.  Patient said that was not her. She had not contacted [place] about this.  She said that she did contact us about a bump on her breast. I saw that note and Anderson Malta had recommended OV but patient has not yet scheduled. She said her husband told her it is a hair follicle.  I again recommended OV since she is going on 3 weeks with this bump. She agreed to schedule. I will have appt desk contact her to schedule appt.

## 2022-04-09 NOTE — Telephone Encounter (Signed)
I have reviewed her chart and don't think she meets criteria for bone density testing yet. Please check the below with her and let me know if she has one of these risk factors.  These are the recommendations: Start testing at 66 unless the patient meets one of the following criteria: -previous fracture -long term steroid use -parent with a h/o a hip fracture -underweight -current cigarette smoking -excessive use of alcohol -rheumatoid arthritis -malabsorption -chronic liver disease or inflammatory bowel disease

## 2022-05-17 DIAGNOSIS — H401131 Primary open-angle glaucoma, bilateral, mild stage: Secondary | ICD-10-CM | POA: Diagnosis not present

## 2022-10-25 ENCOUNTER — Other Ambulatory Visit: Payer: Self-pay | Admitting: Obstetrics and Gynecology

## 2022-10-25 DIAGNOSIS — Z1231 Encounter for screening mammogram for malignant neoplasm of breast: Secondary | ICD-10-CM

## 2022-12-12 DIAGNOSIS — H25813 Combined forms of age-related cataract, bilateral: Secondary | ICD-10-CM | POA: Diagnosis not present

## 2022-12-18 ENCOUNTER — Ambulatory Visit
Admission: RE | Admit: 2022-12-18 | Discharge: 2022-12-18 | Disposition: A | Discharge status: Home / Self Care | Payer: BC Managed Care – PPO | Source: Ambulatory Visit | Attending: Obstetrics and Gynecology | Admitting: Obstetrics and Gynecology

## 2022-12-18 DIAGNOSIS — Z1231 Encounter for screening mammogram for malignant neoplasm of breast: Secondary | ICD-10-CM | POA: Diagnosis not present

## 2023-02-11 NOTE — Progress Notes (Deleted)
57 y.o. G1P0010 Single Black or African American Not Hispanic or Latino female here for annual exam.      Patient's last menstrual period was 08/19/2018 (exact date).          Sexually active: {yes no:314532}  The current method of family planning is {contraception:315051}.    Exercising: {yes no:314532}  {types:19826} Smoker:  {YES [id number]  Health Maintenance: Pap:   09-04-19 normal; 02/04/18 normal, neg HPV  History of abnormal Pap:  no MMG:  12/20/22 denisty C Bi-rads 1 neg  BMD:   never  Colonoscopy: 11-14-18 small sessile polyp, f/u in 7ears TDaP:  2015 Gardasil: N/A   reports that she has never smoked. She has never used smokeless tobacco. She reports current alcohol use of about 5.0 standard drinks of alcohol per week. She reports that she does not use drugs.  Past Medical History:  Diagnosis Date   Bell's palsy 2003   left cheek   Glaucoma    Hypertension    STD (sexually transmitted disease) 1993   hx of syphillis  treated    Past Surgical History:  Procedure Laterality Date   DILATION AND CURETTAGE OF UTERUS     hysteroscopic resection  01/14/06   Removal of polyp and D&C    Current Outpatient Medications  Medication Sig Dispense Refill   amLODipine (NORVASC) 10 MG tablet Take 10 mg by mouth daily.  1   ASPIRIN 81 PO Take by mouth as needed.     Cholecalciferol (VITAMIN D PO) Take by mouth.     dorzolamide (TRUSOPT) 2 % ophthalmic solution 1 drop into both eyes     dorzolamide-timolol (COSOPT) 22.3-6.8 MG/ML ophthalmic solution INSTILL 1 DROP INTO BOTH EYES TWICE A DAY     GARLIC PO Take by mouth.     Netarsudil-Latanoprost (ROCKLATAN) 0.02-0.005 % SOLN 1 drop into both eyes     Omega-3 Fatty Acids (FISH OIL PO) Take by mouth.     No current facility-administered medications for this visit.    Family History  Problem Relation Age of Onset   Hypertension Mother    Glaucoma Mother    Hypertension Father    Glaucoma Maternal Grandmother    Breast cancer  Maternal Aunt     Review of Systems  Exam:   LMP 08/19/2018 (Exact Date) Comment: some bleeding monday  Weight change: @WEIGHTCHANGE @ Height:      Ht Readings from Last 3 Encounters:  02/09/22 5\' 1"  (1.549 m)  01/19/21 5\' 2"  (1.575 m)  02/23/20 5' 2.5" (1.588 m)    General appearance: alert, cooperative and appears stated age Head: Normocephalic, without obvious abnormality, atraumatic Neck: no adenopathy, supple, symmetrical, trachea midline and thyroid {CHL AMB PHY EX THYROID NORM DEFAULT:[phone]::"normal to inspection and palpation"} Lungs: clear to auscultation bilaterally Cardiovascular: regular rate and rhythm Breasts: {Exam; breast:13139::"normal appearance, no masses or tenderness"} Abdomen: soft, non-tender; non distended,  no masses,  no organomegaly Extremities: extremities normal, atraumatic, no cyanosis or edema Skin: Skin color, texture, turgor normal. No rashes or lesions Lymph nodes: Cervical, supraclavicular, and axillary nodes normal. No abnormal inguinal nodes palpated Neurologic: Grossly normal   Pelvic: External genitalia:  no lesions              Urethra:  normal appearing urethra with no masses, tenderness or lesions              Bartholins and Skenes: normal  Vagina: normal appearing vagina with normal color and discharge, no lesions              Cervix: {CHL AMB PHY EX CERVIX NORM DEFAULT:[phone]::"no lesions"}               Bimanual Exam:  Uterus:  {CHL AMB PHY EX UTERUS NORM DEFAULT:[phone]::"normal size, contour, position, consistency, mobility, non-tender"}              Adnexa: {CHL AMB PHY EX ADNEXA NO MASS DEFAULT:[phone]::"no mass, fullness, tenderness"}               Rectovaginal: Confirms               Anus:  normal sphincter tone, no lesions  *** chaperoned for the exam.  A:  Well Woman with normal exam  P:

## 2023-02-14 ENCOUNTER — Ambulatory Visit: Payer: BC Managed Care – PPO | Admitting: Obstetrics and Gynecology

## 2023-03-19 DIAGNOSIS — E7800 Pure hypercholesterolemia, unspecified: Secondary | ICD-10-CM | POA: Diagnosis not present

## 2023-03-19 DIAGNOSIS — I10 Essential (primary) hypertension: Secondary | ICD-10-CM | POA: Diagnosis not present

## 2023-03-19 DIAGNOSIS — Z0000 Encounter for general adult medical examination without abnormal findings: Secondary | ICD-10-CM | POA: Diagnosis not present

## 2023-04-19 ENCOUNTER — Ambulatory Visit: Payer: BC Managed Care – PPO | Admitting: Obstetrics and Gynecology

## 2023-06-12 DIAGNOSIS — F32A Depression, unspecified: Secondary | ICD-10-CM | POA: Diagnosis not present

## 2023-06-12 DIAGNOSIS — F419 Anxiety disorder, unspecified: Secondary | ICD-10-CM | POA: Diagnosis not present

## 2023-06-12 DIAGNOSIS — Z6829 Body mass index (BMI) 29.0-29.9, adult: Secondary | ICD-10-CM | POA: Diagnosis not present

## 2023-08-23 IMAGING — MG MM DIGITAL SCREENING BILAT W/ TOMO AND CAD
6 of 10 series · 6 of 30 positions shown · non-contrast
Comparison: Previous exam(s).

CLINICAL DATA: Screening.

EXAM:
DIGITAL SCREENING BILATERAL MAMMOGRAM WITH TOMOSYNTHESIS AND CAD
TECHNIQUE: Bilateral screening digital craniocaudal and mediolateral oblique
mammograms were obtained. Bilateral screening digital breast
tomosynthesis was performed. The images were evaluated with
computer-aided detection.

[R CC synth-2D (1 of 2)]
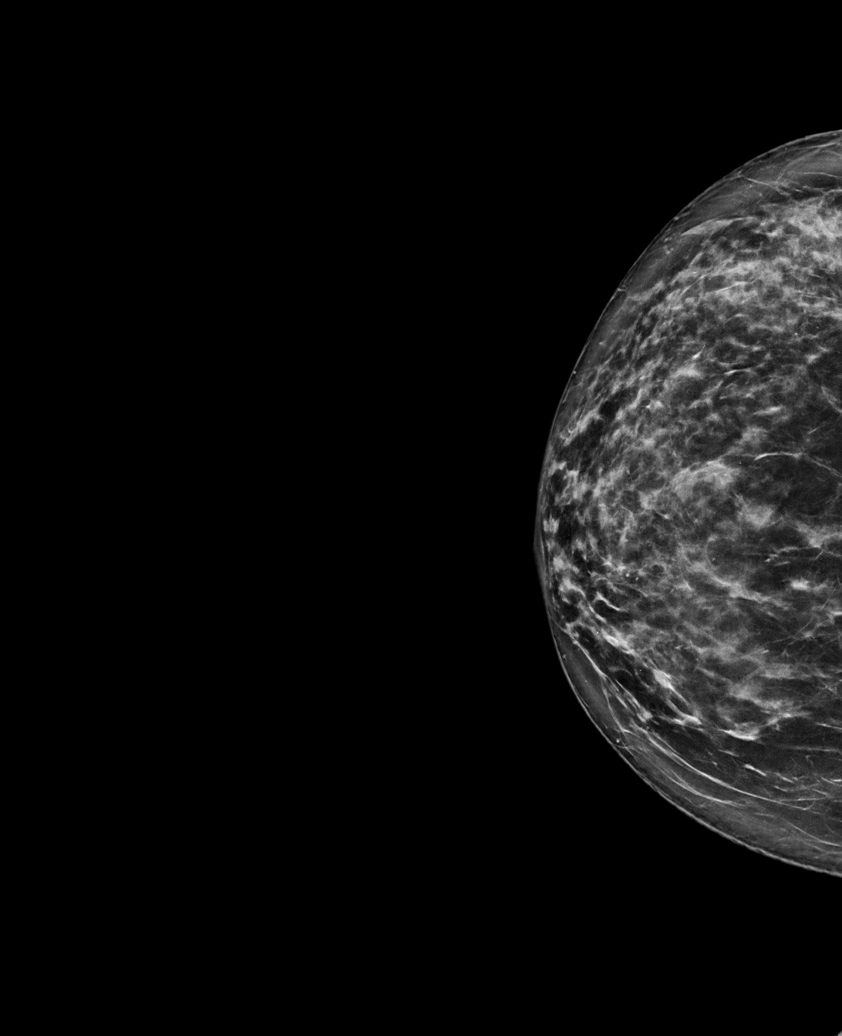

[L CC synth-2D]
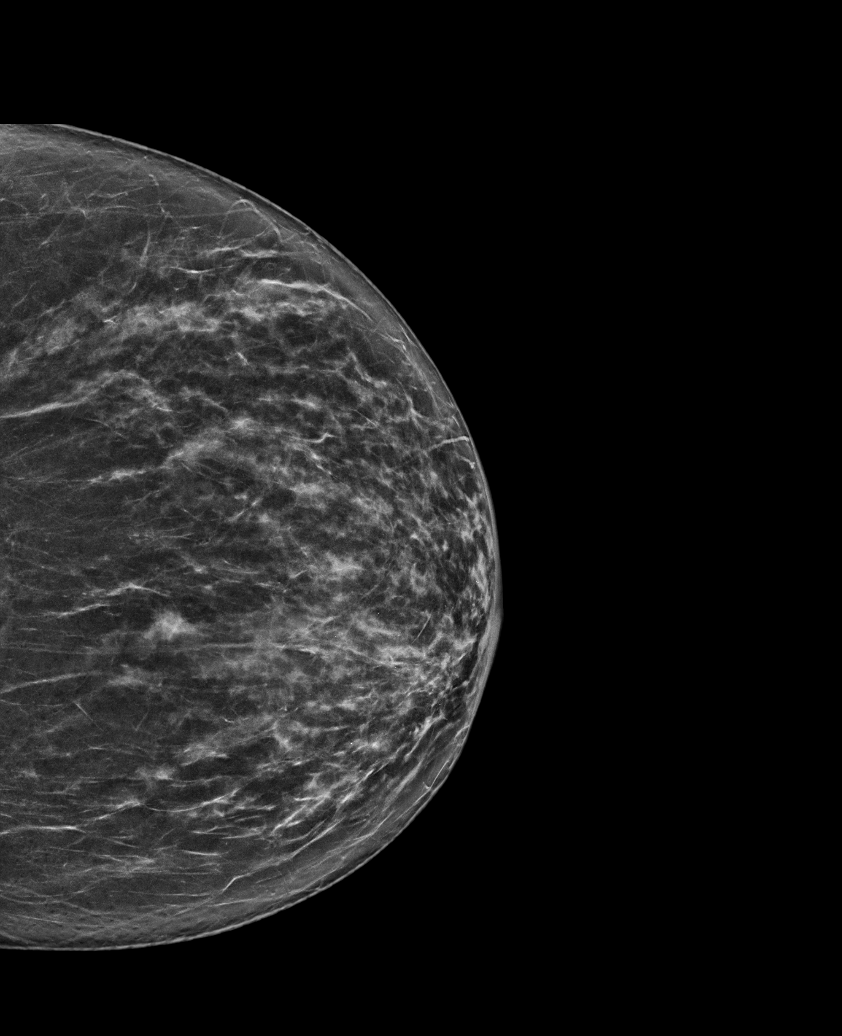

[R CC synth-2D (2 of 2)]
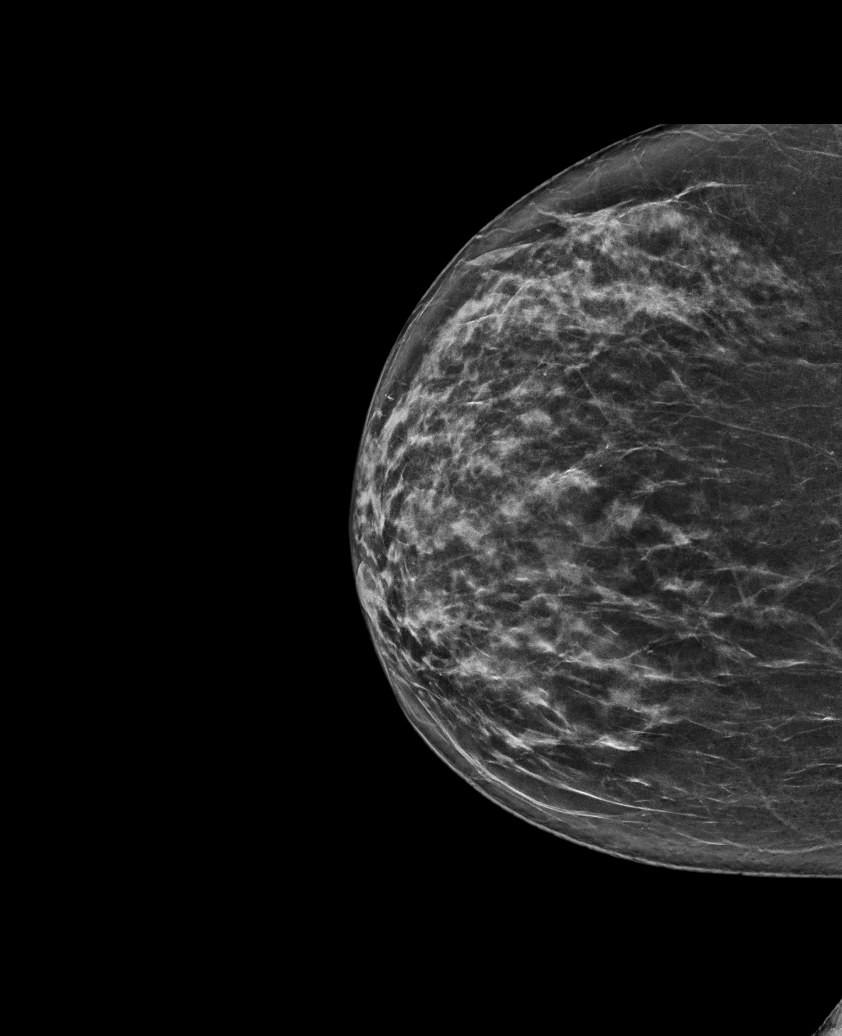

[L MLO synth-2D]
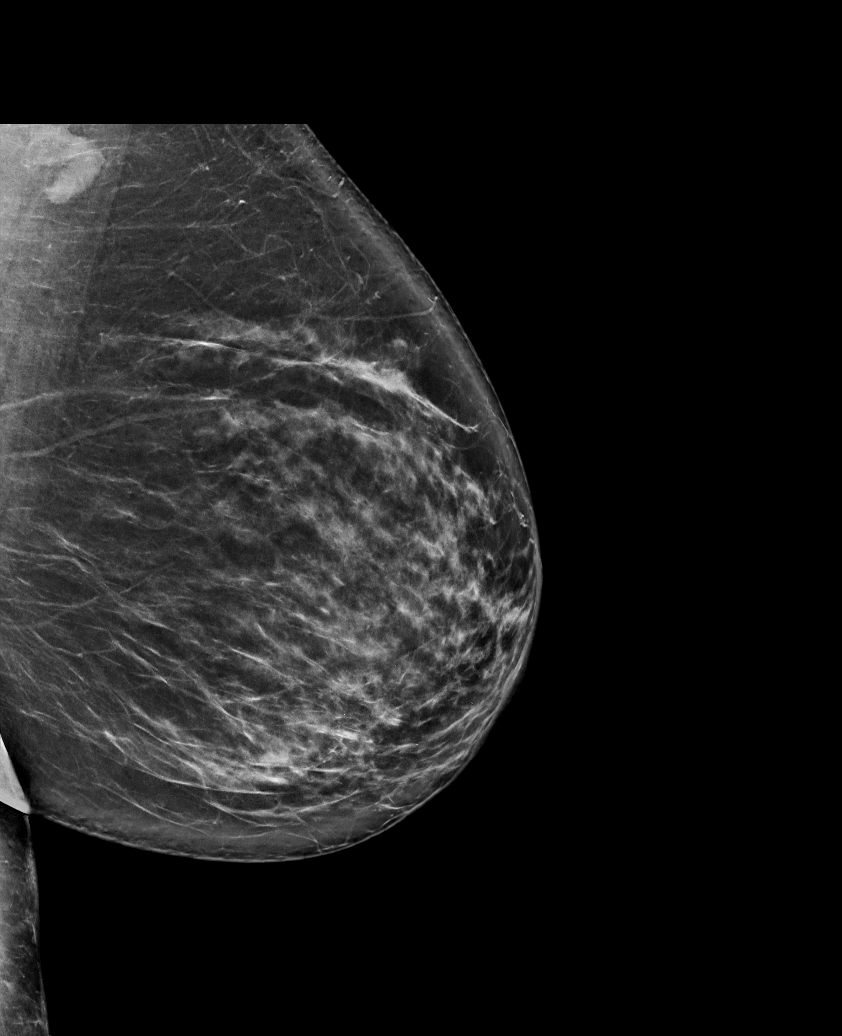

[R MLO synth-2D]
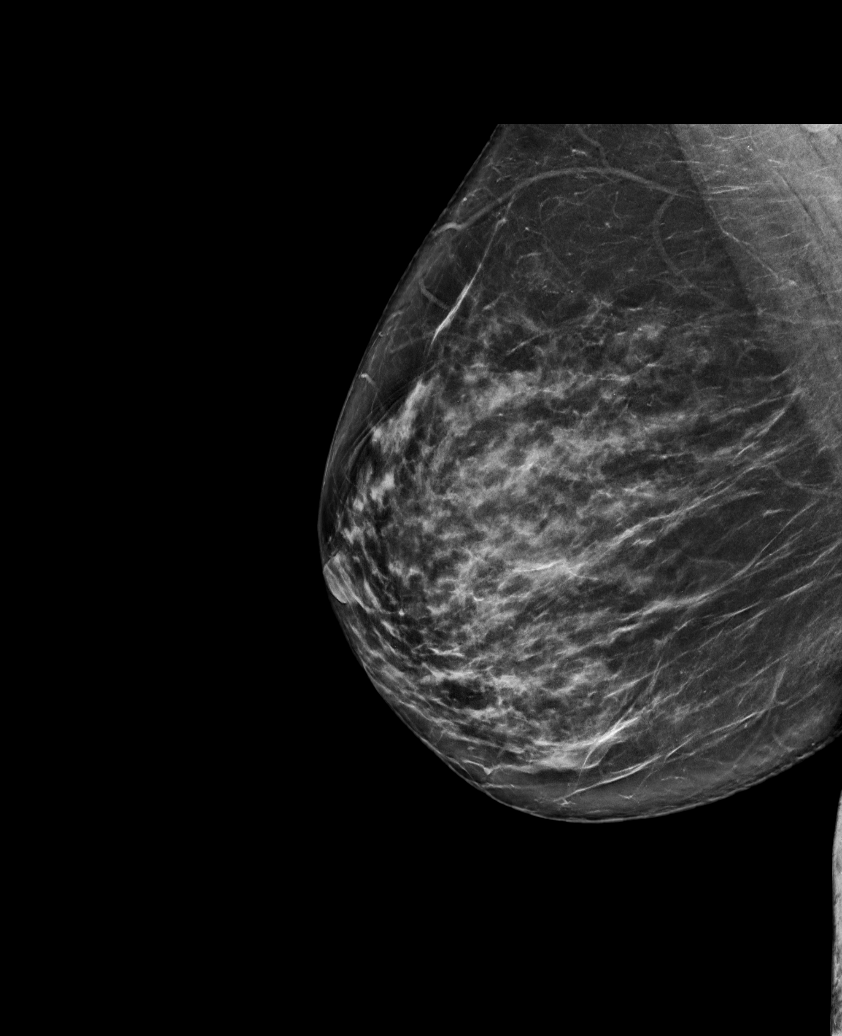

[L CC tomo · tomo slice 37/74.0]
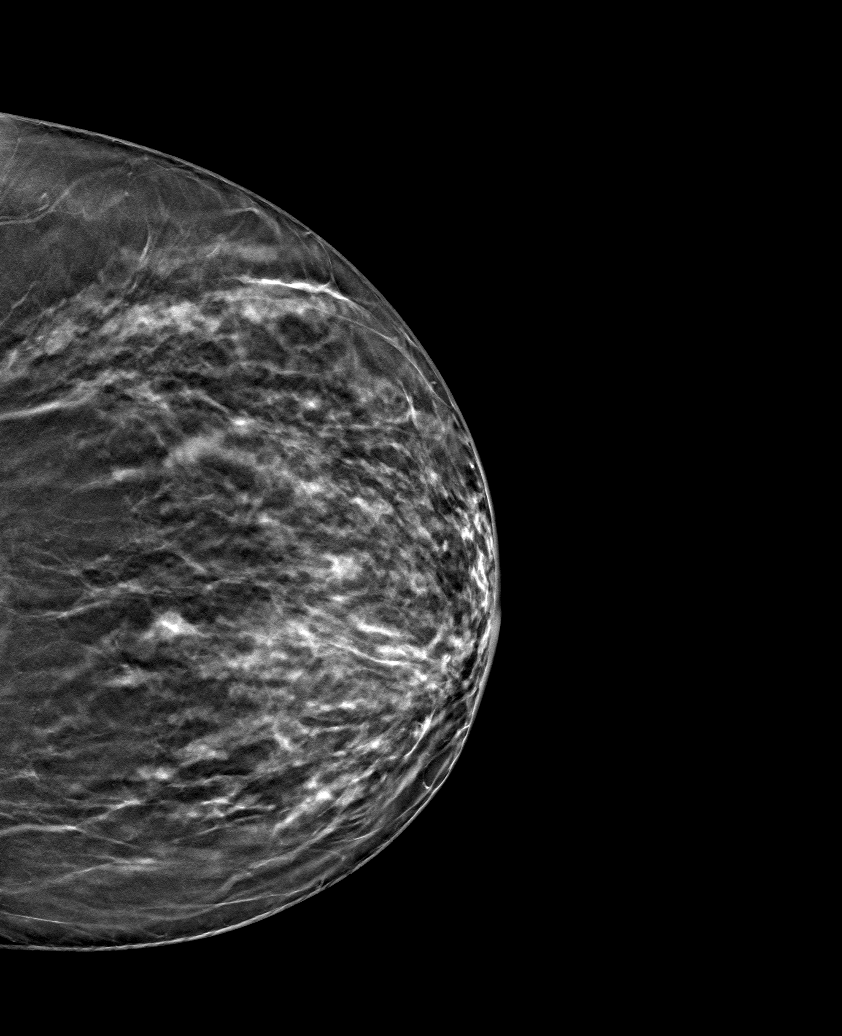

[6 of 30 positions shown; findings below may reference images not displayed]

ACR Breast Density Category c: The breast tissue is heterogeneously
dense, which may obscure small masses.
FINDINGS: There are no findings suspicious for malignancy.
IMPRESSION: No mammographic evidence of malignancy. A result letter of this
screening mammogram will be mailed directly to the patient.

RECOMMENDATION:
Screening mammogram in one year. (Code:Q3-W-BC3)

BI-RADS CATEGORY  1: Negative.

## 2023-09-18 DIAGNOSIS — H25813 Combined forms of age-related cataract, bilateral: Secondary | ICD-10-CM | POA: Diagnosis not present

## 2023-10-29 ENCOUNTER — Other Ambulatory Visit: Payer: Self-pay | Admitting: Family Medicine

## 2023-10-29 DIAGNOSIS — Z1231 Encounter for screening mammogram for malignant neoplasm of breast: Secondary | ICD-10-CM

## 2023-12-20 ENCOUNTER — Ambulatory Visit: Payer: BC Managed Care – PPO

## 2024-01-16 DIAGNOSIS — Z973 Presence of spectacles and contact lenses: Secondary | ICD-10-CM | POA: Diagnosis not present

## 2024-01-16 DIAGNOSIS — H401131 Primary open-angle glaucoma, bilateral, mild stage: Secondary | ICD-10-CM | POA: Diagnosis not present
